# Patient Record
Sex: Female | Born: 1935 | Race: Black or African American | Hispanic: No | Smoking: Former smoker
Health system: Southern US, Community
[De-identification: ages and names within clinical notes are randomized; demographics above are authoritative.]

## PROBLEM LIST (undated history)

## (undated) DIAGNOSIS — Z8551 Personal history of malignant neoplasm of bladder: Secondary | ICD-10-CM

## (undated) DIAGNOSIS — H9190 Unspecified hearing loss, unspecified ear: Secondary | ICD-10-CM

## (undated) DIAGNOSIS — Z992 Dependence on renal dialysis: Secondary | ICD-10-CM

## (undated) DIAGNOSIS — I639 Cerebral infarction, unspecified: Secondary | ICD-10-CM

## (undated) DIAGNOSIS — I509 Heart failure, unspecified: Secondary | ICD-10-CM

## (undated) DIAGNOSIS — M069 Rheumatoid arthritis, unspecified: Secondary | ICD-10-CM

## (undated) DIAGNOSIS — N189 Chronic kidney disease, unspecified: Secondary | ICD-10-CM

## (undated) DIAGNOSIS — N186 End stage renal disease: Secondary | ICD-10-CM

## (undated) DIAGNOSIS — Z9289 Personal history of other medical treatment: Secondary | ICD-10-CM

## (undated) DIAGNOSIS — IMO0001 Reserved for inherently not codable concepts without codable children: Secondary | ICD-10-CM

## (undated) DIAGNOSIS — J449 Chronic obstructive pulmonary disease, unspecified: Secondary | ICD-10-CM

## (undated) DIAGNOSIS — N19 Unspecified kidney failure: Secondary | ICD-10-CM

## (undated) DIAGNOSIS — K449 Diaphragmatic hernia without obstruction or gangrene: Secondary | ICD-10-CM

## (undated) DIAGNOSIS — D649 Anemia, unspecified: Secondary | ICD-10-CM

## (undated) DIAGNOSIS — J189 Pneumonia, unspecified organism: Secondary | ICD-10-CM

## (undated) DIAGNOSIS — K219 Gastro-esophageal reflux disease without esophagitis: Secondary | ICD-10-CM

## (undated) DIAGNOSIS — R0602 Shortness of breath: Secondary | ICD-10-CM

## (undated) DIAGNOSIS — I4891 Unspecified atrial fibrillation: Secondary | ICD-10-CM

## (undated) DIAGNOSIS — J439 Emphysema, unspecified: Secondary | ICD-10-CM

## (undated) DIAGNOSIS — R001 Bradycardia, unspecified: Secondary | ICD-10-CM

## (undated) DIAGNOSIS — I959 Hypotension, unspecified: Secondary | ICD-10-CM

## (undated) DIAGNOSIS — E119 Type 2 diabetes mellitus without complications: Secondary | ICD-10-CM

## (undated) DIAGNOSIS — Z9181 History of falling: Secondary | ICD-10-CM

## (undated) DIAGNOSIS — G4730 Sleep apnea, unspecified: Secondary | ICD-10-CM

## (undated) DIAGNOSIS — I10 Essential (primary) hypertension: Secondary | ICD-10-CM

## (undated) DIAGNOSIS — Z636 Dependent relative needing care at home: Secondary | ICD-10-CM

## (undated) HISTORY — DX: Hypotension, unspecified: I95.9

## (undated) HISTORY — DX: Anemia, unspecified: D64.9

## (undated) HISTORY — DX: Pneumonia, unspecified organism: J18.9

## (undated) HISTORY — DX: Gastro-esophageal reflux disease without esophagitis: K21.9

## (undated) HISTORY — DX: Diaphragmatic hernia without obstruction or gangrene: K44.9

## (undated) HISTORY — PX: TRANSURETHRAL RESECTION OF BLADDER TUMOR: SHX2575

## (undated) HISTORY — DX: History of falling: Z91.81

## (undated) HISTORY — DX: Sleep apnea, unspecified: G47.30

## (undated) HISTORY — PX: CATARACT EXTRACTION: SUR2

## (undated) HISTORY — DX: End stage renal disease: N18.6

## (undated) HISTORY — DX: Personal history of other medical treatment: Z92.89

## (undated) HISTORY — DX: Cerebral infarction, unspecified: I63.9

## (undated) HISTORY — DX: Shortness of breath: R06.02

## (undated) HISTORY — DX: Chronic obstructive pulmonary disease, unspecified: J44.9

## (undated) HISTORY — PX: CYSTOSCOPY: SUR368

## (undated) HISTORY — DX: Bradycardia, unspecified: R00.1

## (undated) HISTORY — PX: JOINT REPLACEMENT: SHX530

## (undated) HISTORY — DX: Unspecified hearing loss, unspecified ear: H91.90

## (undated) HISTORY — DX: Dependent relative needing care at home: Z63.6

## (undated) HISTORY — PX: TOTAL KNEE ARTHROPLASTY: SHX125

## (undated) HISTORY — DX: End stage renal disease: Z99.2

## (undated) HISTORY — PX: LAMINECTOMY: SHX219

## (undated) HISTORY — PX: PACEMAKER INSERTION: SHX728

## (undated) HISTORY — DX: Rheumatoid arthritis, unspecified: M06.9

## (undated) HISTORY — DX: Personal history of malignant neoplasm of bladder: Z85.51

## (undated) HISTORY — PX: TONSILLECTOMY: SUR1361

## (undated) HISTORY — PX: ABDOMINAL HYSTERECTOMY: SHX81

## (undated) HISTORY — PX: CHOLECYSTECTOMY: SHX55

## (undated) HISTORY — DX: Heart failure, unspecified: I50.9

## (undated) HISTORY — DX: Reserved for inherently not codable concepts without codable children: IMO0001

## (undated) HISTORY — PX: BACK SURGERY: SHX140

## (undated) HISTORY — DX: Chronic kidney disease, unspecified: N18.9

## (undated) HISTORY — PX: SHOULDER SURGERY: SHX246

## (undated) HISTORY — PX: AV FISTULA PLACEMENT: SHX1204

---

## 2009-11-07 ENCOUNTER — Ambulatory Visit: Payer: Self-pay | Admitting: Radiology

## 2009-11-07 ENCOUNTER — Emergency Department (HOSPITAL_BASED_OUTPATIENT_CLINIC_OR_DEPARTMENT_OTHER): Admission: EM | Admit: 2009-11-07 | Discharge: 2009-11-07 | Payer: Self-pay | Admitting: Emergency Medicine

## 2010-11-09 LAB — COMPREHENSIVE METABOLIC PANEL
ALT: 20 U/L (ref 0–35)
AST: 35 U/L (ref 0–37)
Albumin: 4 g/dL (ref 3.5–5.2)
Chloride: 102 mEq/L (ref 96–112)
Potassium: 5.1 mEq/L (ref 3.5–5.1)
Total Bilirubin: 0.7 mg/dL (ref 0.3–1.2)

## 2010-11-09 LAB — POCT CARDIAC MARKERS
CKMB, poc: 1.7 ng/mL (ref 1.0–8.0)
CKMB, poc: <1 ng/mL — ABNORMAL LOW (ref 1.0–8.0)
Myoglobin, poc: 143 ng/mL (ref 12–200)
Myoglobin, poc: 161 ng/mL (ref 12–200)
Troponin i, poc: <0.05 ng/mL (ref 0.00–0.09)
Troponin i, poc: <0.05 ng/mL (ref 0.00–0.09)

## 2010-11-09 LAB — CBC
HCT: 26.9 % — ABNORMAL LOW (ref 36.0–46.0)
Hemoglobin: 8.6 g/dL — ABNORMAL LOW (ref 12.0–15.0)
MCV: 76.2 fL — ABNORMAL LOW (ref 78.0–100.0)
Platelets: 213 10*3/uL (ref 150–400)
RBC: 3.53 MIL/uL — ABNORMAL LOW (ref 3.87–5.11)
RDW: 15.7 % — ABNORMAL HIGH (ref 11.5–15.5)

## 2010-11-09 LAB — DIFFERENTIAL
Lymphocytes Relative: 15 % (ref 12–46)
Lymphs Abs: 1.3 10*3/uL (ref 0.7–4.0)
Monocytes Relative: 10 % (ref 3–12)

## 2010-11-09 LAB — PROTIME-INR: Prothrombin Time: 15.8 seconds — ABNORMAL HIGH (ref 11.6–15.2)

## 2010-11-09 LAB — POCT B-TYPE NATRIURETIC PEPTIDE (BNP): B Natriuretic Peptide, POC: 613 pg/mL — ABNORMAL HIGH (ref 0–100)

## 2013-12-01 ENCOUNTER — Emergency Department (HOSPITAL_BASED_OUTPATIENT_CLINIC_OR_DEPARTMENT_OTHER): Payer: MEDICARE

## 2013-12-01 ENCOUNTER — Inpatient Hospital Stay (HOSPITAL_BASED_OUTPATIENT_CLINIC_OR_DEPARTMENT_OTHER)
Admission: EM | Admit: 2013-12-01 | Discharge: 2013-12-04 | DRG: 064 | Disposition: A | Discharge status: Home Health | Payer: MEDICARE | Attending: Internal Medicine | Admitting: Internal Medicine

## 2013-12-01 ENCOUNTER — Encounter (HOSPITAL_BASED_OUTPATIENT_CLINIC_OR_DEPARTMENT_OTHER): Payer: Self-pay | Admitting: Emergency Medicine

## 2013-12-01 DIAGNOSIS — R4789 Other speech disturbances: Secondary | ICD-10-CM | POA: Diagnosis present

## 2013-12-01 DIAGNOSIS — E118 Type 2 diabetes mellitus with unspecified complications: Secondary | ICD-10-CM

## 2013-12-01 DIAGNOSIS — I639 Cerebral infarction, unspecified: Secondary | ICD-10-CM | POA: Diagnosis present

## 2013-12-01 DIAGNOSIS — M899 Disorder of bone, unspecified: Secondary | ICD-10-CM | POA: Diagnosis present

## 2013-12-01 DIAGNOSIS — H9190 Unspecified hearing loss, unspecified ear: Secondary | ICD-10-CM | POA: Diagnosis present

## 2013-12-01 DIAGNOSIS — I4891 Unspecified atrial fibrillation: Secondary | ICD-10-CM | POA: Diagnosis present

## 2013-12-01 DIAGNOSIS — I6350 Cerebral infarction due to unspecified occlusion or stenosis of unspecified cerebral artery: Principal | ICD-10-CM

## 2013-12-01 DIAGNOSIS — J449 Chronic obstructive pulmonary disease, unspecified: Secondary | ICD-10-CM | POA: Diagnosis present

## 2013-12-01 DIAGNOSIS — E1165 Type 2 diabetes mellitus with hyperglycemia: Secondary | ICD-10-CM

## 2013-12-01 DIAGNOSIS — Z992 Dependence on renal dialysis: Secondary | ICD-10-CM

## 2013-12-01 DIAGNOSIS — I10 Essential (primary) hypertension: Secondary | ICD-10-CM

## 2013-12-01 DIAGNOSIS — Z794 Long term (current) use of insulin: Secondary | ICD-10-CM

## 2013-12-01 DIAGNOSIS — N186 End stage renal disease: Secondary | ICD-10-CM

## 2013-12-01 DIAGNOSIS — Z8673 Personal history of transient ischemic attack (TIA), and cerebral infarction without residual deficits: Secondary | ICD-10-CM

## 2013-12-01 DIAGNOSIS — E785 Hyperlipidemia, unspecified: Secondary | ICD-10-CM | POA: Diagnosis present

## 2013-12-01 DIAGNOSIS — Z87891 Personal history of nicotine dependence: Secondary | ICD-10-CM

## 2013-12-01 DIAGNOSIS — R4701 Aphasia: Secondary | ICD-10-CM | POA: Diagnosis present

## 2013-12-01 DIAGNOSIS — M949 Disorder of cartilage, unspecified: Secondary | ICD-10-CM

## 2013-12-01 DIAGNOSIS — J438 Other emphysema: Secondary | ICD-10-CM | POA: Diagnosis present

## 2013-12-01 DIAGNOSIS — D649 Anemia, unspecified: Secondary | ICD-10-CM | POA: Diagnosis present

## 2013-12-01 DIAGNOSIS — IMO0002 Reserved for concepts with insufficient information to code with codable children: Secondary | ICD-10-CM

## 2013-12-01 DIAGNOSIS — E119 Type 2 diabetes mellitus without complications: Secondary | ICD-10-CM | POA: Diagnosis present

## 2013-12-01 DIAGNOSIS — Z66 Do not resuscitate: Secondary | ICD-10-CM | POA: Diagnosis present

## 2013-12-01 DIAGNOSIS — R471 Dysarthria and anarthria: Secondary | ICD-10-CM | POA: Diagnosis present

## 2013-12-01 DIAGNOSIS — I120 Hypertensive chronic kidney disease with stage 5 chronic kidney disease or end stage renal disease: Secondary | ICD-10-CM | POA: Diagnosis present

## 2013-12-01 DIAGNOSIS — Z79899 Other long term (current) drug therapy: Secondary | ICD-10-CM

## 2013-12-01 HISTORY — DX: Unspecified kidney failure: N19

## 2013-12-01 HISTORY — DX: Essential (primary) hypertension: I10

## 2013-12-01 HISTORY — DX: Dependence on renal dialysis: Z99.2

## 2013-12-01 HISTORY — DX: Type 2 diabetes mellitus without complications: E11.9

## 2013-12-01 HISTORY — DX: Cerebral infarction, unspecified: I63.9

## 2013-12-01 HISTORY — DX: Emphysema, unspecified: J43.9

## 2013-12-01 LAB — CBC
HCT: 35.8 % — ABNORMAL LOW (ref 36.0–46.0)
Hemoglobin: 11.9 g/dL — ABNORMAL LOW (ref 12.0–15.0)
MCH: 24.4 pg — ABNORMAL LOW (ref 26.0–34.0)
MCHC: 33.2 g/dL (ref 30.0–36.0)
MCV: 73.5 fL — ABNORMAL LOW (ref 78.0–100.0)
Platelets: 167 10*3/uL (ref 150–400)
RBC: 4.87 MIL/uL (ref 3.87–5.11)
RDW: 17.2 % — AB (ref 11.5–15.5)
WBC: 5.4 10*3/uL (ref 4.0–10.5)

## 2013-12-01 LAB — COMPREHENSIVE METABOLIC PANEL
ALBUMIN: 3.6 g/dL (ref 3.5–5.2)
ALK PHOS: 369 U/L — AB (ref 39–117)
ALT: 30 U/L (ref 0–35)
AST: 41 U/L — AB (ref 0–37)
BILIRUBIN TOTAL: 0.5 mg/dL (ref 0.3–1.2)
BUN: 27 mg/dL — ABNORMAL HIGH (ref 6–23)
CO2: 26 meq/L (ref 19–32)
Calcium: 9.9 mg/dL (ref 8.4–10.5)
Chloride: 97 mEq/L (ref 96–112)
Creatinine, Ser: 4.3 mg/dL — ABNORMAL HIGH (ref 0.50–1.10)
GFR calc Af Amer: 9 mL/min — ABNORMAL LOW (ref >90)
GFR calc non Af Amer: 8 mL/min — ABNORMAL LOW (ref >90)
Glucose, Bld: 164 mg/dL — ABNORMAL HIGH (ref 70–99)
Potassium: 4.4 mEq/L (ref 3.7–5.3)
SODIUM: 140 meq/L (ref 137–147)
Total Protein: 8.2 g/dL (ref 6.0–8.3)

## 2013-12-01 LAB — ETHANOL

## 2013-12-01 LAB — CBG MONITORING, ED: Glucose-Capillary: 147 mg/dL — ABNORMAL HIGH (ref 70–99)

## 2013-12-01 LAB — DIFFERENTIAL
BASOS ABS: 0 10*3/uL (ref 0.0–0.1)
BASOS PCT: 0 % (ref 0–1)
Eosinophils Absolute: 0.1 10*3/uL (ref 0.0–0.7)
Eosinophils Relative: 2 % (ref 0–5)
Lymphocytes Relative: 34 % (ref 12–46)
Lymphs Abs: 1.8 10*3/uL (ref 0.7–4.0)
Monocytes Absolute: 0.9 10*3/uL (ref 0.1–1.0)
Monocytes Relative: 16 % — ABNORMAL HIGH (ref 3–12)
NEUTROS ABS: 2.6 10*3/uL (ref 1.7–7.7)
NEUTROS PCT: 47 % (ref 43–77)

## 2013-12-01 LAB — PROTIME-INR
INR: 1.14 (ref 0.00–1.49)
Prothrombin Time: 14.4 seconds (ref 11.6–15.2)

## 2013-12-01 LAB — TROPONIN I: Troponin I: <0.3 ng/mL (ref <0.30)

## 2013-12-01 LAB — GLUCOSE, CAPILLARY
GLUCOSE-CAPILLARY: 90 mg/dL (ref 70–99)
Glucose-Capillary: 106 mg/dL — ABNORMAL HIGH (ref 70–99)

## 2013-12-01 LAB — APTT: aPTT: 39 seconds — ABNORMAL HIGH (ref 24–37)

## 2013-12-01 MED ORDER — ASPIRIN 325 MG PO TABS
325.0000 mg | ORAL_TABLET | Freq: Every day | ORAL | Status: DC
  Administered 2013-12-03 – 2013-12-04 (×2): 325 mg via ORAL
  Filled 2013-12-01 (×3): qty 1

## 2013-12-01 MED ORDER — PANTOPRAZOLE SODIUM 40 MG IV SOLR
40.0000 mg | Freq: Every day | INTRAVENOUS | Status: DC
  Administered 2013-12-01 – 2013-12-02 (×2): 40 mg via INTRAVENOUS
  Filled 2013-12-01 (×4): qty 40

## 2013-12-01 MED ORDER — ALPRAZOLAM 0.25 MG PO TABS: 0.2500 mg | ORAL_TABLET | Freq: Once | ORAL | Status: AC | PRN

## 2013-12-01 MED ORDER — ACETAMINOPHEN 650 MG RE SUPP: 650.0000 mg | RECTAL | Status: DC | PRN

## 2013-12-01 MED ORDER — ASPIRIN 300 MG RE SUPP
300.0000 mg | Freq: Every day | RECTAL | Status: DC
  Administered 2013-12-01 – 2013-12-02 (×2): 300 mg via RECTAL
  Filled 2013-12-01 (×4): qty 1

## 2013-12-01 MED ORDER — SODIUM CHLORIDE 0.9 % IV SOLN
INTRAVENOUS | Status: DC
  Administered 2013-12-01: 22:00:00 via INTRAVENOUS

## 2013-12-01 MED ORDER — INSULIN ASPART 100 UNIT/ML ~~LOC~~ SOLN: 0.0000 [IU] | SUBCUTANEOUS | Status: DC

## 2013-12-01 MED ORDER — ACETAMINOPHEN 325 MG PO TABS
650.0000 mg | ORAL_TABLET | ORAL | Status: DC | PRN
  Administered 2013-12-02: 650 mg via ORAL
  Filled 2013-12-01: qty 2

## 2013-12-01 MED ORDER — ALBUTEROL SULFATE (2.5 MG/3ML) 0.083% IN NEBU
2.5000 mg | INHALATION_SOLUTION | Freq: Four times a day (QID) | RESPIRATORY_TRACT | Status: DC | PRN
Start: 2013-12-01 — End: 2013-12-04

## 2013-12-01 MED ORDER — GABAPENTIN 300 MG PO CAPS
300.0000 mg | ORAL_CAPSULE | Freq: Every day | ORAL | Status: DC
  Administered 2013-12-02 – 2013-12-03 (×2): 300 mg via ORAL
  Filled 2013-12-01 (×4): qty 1

## 2013-12-01 MED ORDER — ALLOPURINOL 100 MG PO TABS
100.0000 mg | ORAL_TABLET | Freq: Every day | ORAL | Status: DC
  Administered 2013-12-03 – 2013-12-04 (×2): 100 mg via ORAL
  Filled 2013-12-01 (×4): qty 1

## 2013-12-01 MED ORDER — ALBUTEROL SULFATE (2.5 MG/3ML) 0.083% IN NEBU: 3.0000 mL | INHALATION_SOLUTION | Freq: Four times a day (QID) | RESPIRATORY_TRACT | Status: DC | PRN

## 2013-12-01 MED ORDER — IPRATROPIUM BROMIDE 0.02 % IN SOLN: 0.5000 mg | Freq: Four times a day (QID) | RESPIRATORY_TRACT | Status: DC

## 2013-12-01 NOTE — ED Notes (Signed)
Dr. Roselyn BeringJ Knapp and Asher MuirJamie, Charge RN aware of patient's presentation.  I will defer calling a Code Stroke to them and their assessment d/t difficulty establishing last time the patient was seen normal during my brief triage.

## 2013-12-01 NOTE — H&P (Signed)
PCP:  Renal ESRD on HD  [facility] Friday Cardiology Reuel Boomaniel with [facility] Chief Complaint:  Slurred speech  HPI: Stacy MaffucciMarie Odonnell is a 80 y.o. female   has a past medical history of Stroke; Diabetes mellitus without complication; Hypertension; Cancer; Dialysis patient; Emphysema lung; and Renal failure.   Presented with  Patient was noted today to be slurring her speech around 11:30. [facility] pm her speech has improved somewhat but not back to normal yet. She is on HD 3 times a week on M, W, and F. Last dialysis was on Friday 4/17 and went without trouble. Patient states they took off 6 lb. She have had some abdominal pain and constipation denies any fever chills or chest pain. She did not have any trouble walking. Denies any perceived weakness. She has hx of a.fib but not on anticoagulation.  Family states she was on some form of anticoagulation but that caused excessive bruising. She Had hx of TIA about 2 years ago.   Review of Systems:    Pertinent positives include: slurred speech  Constitutional:  No weight loss, night sweats, Fevers, chills, fatigue, weight loss  HEENT:  No headaches, Difficulty swallowing,Tooth/dental problems, Sore throat,  No sneezing, itching, ear ache, nasal congestion, post nasal drip,  Cardio-vascular:  No chest pain, Orthopnea, PND, anasarca, dizziness, palpitations.no Bilateral lower extremity swelling  GI:  No heartburn, indigestion, abdominal pain, nausea, vomiting, diarrhea, change in bowel habits, loss of appetite, melena, blood in stool, hematemesis Resp:  no shortness of breath at rest. No dyspnea on exertion, No excess mucus, no productive cough, No non-productive cough, No coughing up of blood.No change in color of mucus.No wheezing. Skin:  no rash or lesions. No jaundice GU:  no dysuria, change in color of urine, no urgency or frequency. No straining to urinate.  No flank pain.  Musculoskeletal:   No joint pain or no joint swelling. No decreased range of motion. No back pain.  Psych:  No change in mood or affect. No depression or anxiety. No memory loss.  Neuro: no localizing neurological complaints, no tingling, no weakness, no double vision, no gait abnormality, no , no confusion  Otherwise ROS are negative except for above, 10 systems were reviewed  Past Medical History: Past Medical History  Diagnosis Date  . Stroke   . Diabetes mellitus without complication   . Hypertension   . Cancer   . Dialysis patient   . Emphysema lung   . Renal failure    Past Surgical History  Procedure Laterality Date  . Abdominal hysterectomy    . Cholecystectomy    . Tonsillectomy    . Joint replacement    . Shoulder surgery       Medications: Prior to Admission medications   Medication Sig Start Date End Date Taking? Authorizing Provider  acetaminophen (TYLENOL) 650 MG CR tablet Take 650 mg by mouth every 8 (eight) hours as needed for pain.   Yes Historical Provider, MD  albuterol (PROVENTIL HFA;VENTOLIN HFA) 108 (90 BASE) MCG/ACT inhaler Inhale 1 puff into the lungs every 6 (six) hours as needed for wheezing or shortness of breath.   Yes Historical Provider, MD  albuterol (PROVENTIL) (2.5 MG/3ML) 0.083% nebulizer solution Take 2.5 mg by nebulization every 6 (six) hours as needed for wheezing or shortness of breath.   Yes Historical Provider, MD  allopurinol (ZYLOPRIM) 100 MG tablet Take 100 mg by mouth daily.   Yes Historical  Provider, MD  aspirin 81 MG tablet Take 81 mg by mouth daily.   Yes Historical Provider, MD  calcium carbonate (OS-CAL) 600 MG TABS tablet Take 600 mg by mouth 2 (two) times daily with a meal. With D   Yes Historical Provider, MD  clotrimazole (LOTRIMIN) 1 % cream Apply 1 application topically.   Yes Historical Provider, MD  cyclobenzaprine (FLEXERIL) 5 MG tablet Take 5 mg by mouth as needed for muscle spasms.   Yes Historical Provider, MD  docusate sodium  (COLACE) 100 MG capsule Take 100 mg by mouth 2 (two) times daily.   Yes Historical Provider, MD  furosemide (LASIX) 40 MG tablet Take 40 mg by mouth 2 (two) times daily.   Yes Historical Provider, MD  gabapentin (NEURONTIN) 300 MG capsule Take 300 mg by mouth at bedtime.   Yes Historical Provider, MD  insulin glargine (LANTUS) 100 UNIT/ML injection Inject into the skin at bedtime. 10-30 units   Yes Historical Provider, MD  lactobacillus acidophilus (BACID) TABS tablet Take 1 tablet by mouth 3 (three) times daily.   Yes Historical Provider, MD  omega-3 acid ethyl esters (LOVAZA) 1 G capsule Take 1,500 mg by mouth 2 (two) times daily. Omega 3,6,9   Yes Historical Provider, MD  omeprazole (PRILOSEC) 20 MG capsule Take 20 mg by mouth 2 (two) times daily before a meal.   Yes Historical Provider, MD  sevelamer carbonate (RENVELA) 800 MG tablet Take 800 mg by mouth 3 (three) times daily with meals. Once with snack   Yes Historical Provider, MD  simvastatin (ZOCOR) 40 MG tablet Take 40 mg by mouth daily.   Yes Historical Provider, MD  tiotropium (SPIRIVA) 18 MCG inhalation capsule Place 18 mcg into inhaler and inhale daily.   Yes Historical Provider, MD    Allergies:   Allergies  Allergen Reactions  . Codeine   . Levaquin [Levofloxacin In D5w]   . Vicodin [Hydrocodone-Acetaminophen]     Confused, hypoxia  . Vistaril [Hydroxyzine Hcl]     Social History:  Ambulatory  walker   Lives at   Home with family   reports that she has quit smoking. She does not have any smokeless tobacco history on file.   [facility] [phone] Cell [phone]   Family History: family history is not on file.    Physical Exam: Patient Vitals for the past 24 hrs:  BP Temp Temp src Pulse Resp SpO2 Height Weight  12/01/13 1804 150/59 mmHg 98 F (36.7 C) Oral 57 16 99 % 5\' 3"  (1.6 m) 83.961 kg (185 lb 1.6 oz)  12/01/13 1627 138/57 mmHg - - 60 16 96 % - -  12/01/13 1548 139/33 mmHg - - 54 16 97 % -  -  12/01/13 1357 151/58 mmHg - - 68 20 96 % - -    1. General:  in No Acute distress 2. Psychological: Alert and  Oriented but with slurred speech 3. Head/ENT:   Moist   Mucous Membranes                          Head Non traumatic, neck supple                          Normal   Dentition 4. SKIN: normal  Skin turgor,  Skin clean Dry and intact no rash 5. Heart: Regular rate and rhythm systolic Murmur, Rub or gallop 6. Lungs:  Clear to auscultation bilaterally, no wheezes or crackles   7. Abdomen: Soft, non-tender, Non distended 8. Lower extremities: no clubbing, cyanosis, or edema 9. Neurologically somewhat diminished grip in Left arm and Leg she is unsure if a change from prior patient has hx of right MCA CVA in the past. Slurred speech and word finding difficulty but CN 2-12 intact 10. MSK: Normal range of motion  body mass index is 32.8 kg/(m^2).   Labs on Admission:   Recent Labs  12/01/13 1505  NA 140  K 4.4  CL 97  CO2 26  GLUCOSE 164*  BUN 27*  CREATININE 4.30*  CALCIUM 9.9    Recent Labs  12/01/13 1505  AST 41*  ALT 30  ALKPHOS 369*  BILITOT 0.5  PROT 8.2  ALBUMIN 3.6   No results found for this basename: LIPASE, AMYLASE,  in the last 72 hours  Recent Labs  12/01/13 1505  WBC 5.4  NEUTROABS 2.6  HGB 11.9*  HCT 35.8*  MCV 73.5*  PLT 167    Recent Labs  12/01/13 1505  TROPONINI <0.30   No results found for this basename: TSH, T4TOTAL, FREET3, T3FREE, THYROIDAB,  in the last 72 hours No results found for this basename: VITAMINB12, FOLATE, FERRITIN, TIBC, IRON, RETICCTPCT,  in the last 72 hours No results found for this basename: HGBA1C    Estimated Creatinine Clearance: 8.6 ml/min (by C-G formula based on Cr of 4.3). ABG No results found for this basename: phart, pco2, po2, hco3, tco2, acidbasedef, o2sat     No results found for this basename: DDIMER     Other results:  I have pearsonaly reviewed this: ECG REPORT  Rate: 62  Rhythm:  a.fib with RBBB ST&T Change: no ischemic changes   Cultures: No results found for this basename: sdes, specrequest, cult, reptstatus       Radiological Exams on Admission: Ct Head Wo Contrast  12/01/2013   CLINICAL DATA:  80 year old female code stroke. Slurred speech. Initial encounter.  EXAM: CT HEAD WITHOUT CONTRAST  TECHNIQUE: Contiguous axial images were obtained from the base of the skull through the vertex without intravenous contrast.  COMPARISON:  08/22/2013 and earlier.  FINDINGS: Stable visualized osseous structures. Stable paranasal sinuses and mastoids. No acute orbit or scalp soft tissue findings.  Calcified atherosclerosis at the skull base. Cerebral volume is within normal limits for age. Chronic posterior right MCA infarct re- identified with encephalomalacia. Chronic small left PCA infarct affecting the left occipital pole re- identified and stable.  New cortically based hypodensity in the posterior left MCA territory. Series 2, image 23). Parietal lobe primarily affected. No associated mass effect or hemorrhage is evident.  No suspicious intracranial vascular hyperdensity. Stable gray-white matter differentiation elsewhere. No ventriculomegaly. No midline shift, mass effect, or evidence of intracranial mass lesion.  IMPRESSION: 1. Acute/subacute posterior left MCA infarct. No associated mass effect or hemorrhage at this time. 2. Stable chronic right MCA and left PCA infarcts. Study discussed by telephone with Dr. Linwood Dibbles on 12/01/2013 at 14:34 .   Electronically Signed   By: Augusto Gamble M.D.   On: 12/01/2013 14:34    Chart has been reviewed  Assessment/Plan 80 yo F with hx of A.fib, DM, HTN, ESRD on HD M,W,F here with Left MCA infarct  Present on Admission:  . Stroke -   will admit based on TIA/CVA protocol, await results of MRA/MRI, Carotid Doppler and Echo,    ECG, Lipid panel, TSH. Order PT/OT evaluation. Will make  sure patient is on antiplatelet agent.  Neurology consult  spoke Dr. Roseanne RenoStewart who is aware.     . Hypertension - hold lasix for first 24 hours restart as needed . Type II or unspecified type diabetes mellitus with unspecified complication, uncontrolled - hold lantus as patient have not passed swallow eval and will be NPO . ESRD on dialysis - will arrange for HD on Monday,     Prophylaxis: SCD,   Protonix  CODE STATUS: DNR/DNI as per Family and patient  Other plan as per orders.  I have spent a total of 55 min on this admission  Ladarion Munyon 12/01/2013, 7:13 PM

## 2013-12-01 NOTE — ED Provider Notes (Signed)
CSN: [id number]     Arrival date & time 12/01/13  1340 History  First MD Initiated Contact with Patient 12/01/13 1351     Chief Complaint  Patient presents with  . Code Stroke   HPI Patient presents to the emergency room with an acute change in her ability to speak. The daughter states that she woke patient up at 0930 this morning to check her blood sugar. Patient had been sleeping at the time. When the patient woke up she was speaking normally.  The daughter checked her blood sugar and then left for a period of time.  Except on her again they noted that she was having difficulty with her speech. She seemed to try to say certain words but was unable to do so. Patient has history of kidney failure. She is on dialysis Monday Wednesday and Friday. She usually can walk with a walker.  He has no issues with confusion. She does have history of prior TIA/stroke but no chronic deficits. She does not have any history of recent surgery. No head injury. Past Medical History  Diagnosis Date  . Stroke   . Diabetes mellitus without complication   . Hypertension   . Cancer   . Dialysis patient   . Emphysema lung   . Renal failure    Past Surgical History  Procedure Laterality Date  . Abdominal hysterectomy    . Cholecystectomy    . Tonsillectomy    . Joint replacement    . Shoulder surgery     No family history on file. History  Substance Use Topics  . Smoking status: Former Games developer  . Smokeless tobacco: Not on file  . Alcohol Use: Not on file   OB History   Grav Para Term Preterm Abortions TAB SAB Ect Mult Living                 Review of Systems  All other systems reviewed and are negative.     Allergies  Codeine; Levaquin; and Vistaril  Home Medications   Prior to Admission medications   Medication Sig Start Date End Date Taking? Authorizing Provider  acetaminophen (TYLENOL) 650 MG CR tablet Take 650 mg by mouth every 8 (eight) hours as needed for pain.   Yes Historical  Provider, MD  albuterol (PROVENTIL HFA;VENTOLIN HFA) 108 (90 BASE) MCG/ACT inhaler Inhale 1 puff into the lungs every 6 (six) hours as needed for wheezing or shortness of breath.   Yes Historical Provider, MD  albuterol (PROVENTIL) (2.5 MG/3ML) 0.083% nebulizer solution Take 2.5 mg by nebulization every 6 (six) hours as needed for wheezing or shortness of breath.   Yes Historical Provider, MD  allopurinol (ZYLOPRIM) 100 MG tablet Take 100 mg by mouth daily.   Yes Historical Provider, MD  aspirin 81 MG tablet Take 81 mg by mouth daily.   Yes Historical Provider, MD  calcium carbonate (OS-CAL) 600 MG TABS tablet Take 600 mg by mouth 2 (two) times daily with a meal. With D   Yes Historical Provider, MD  clotrimazole (LOTRIMIN) 1 % cream Apply 1 application topically.   Yes Historical Provider, MD  cyclobenzaprine (FLEXERIL) 5 MG tablet Take 5 mg by mouth as needed for muscle spasms.   Yes Historical Provider, MD  docusate sodium (COLACE) 100 MG capsule Take 100 mg by mouth 2 (two) times daily.   Yes Historical Provider, MD  furosemide (LASIX) 40 MG tablet Take 40 mg by mouth 2 (two) times daily.   Yes Historical Provider,  MD  gabapentin (NEURONTIN) 300 MG capsule Take 300 mg by mouth at bedtime.   Yes Historical Provider, MD  insulin glargine (LANTUS) 100 UNIT/ML injection Inject into the skin at bedtime. 10-30 units   Yes Historical Provider, MD  lactobacillus acidophilus (BACID) TABS tablet Take 1 tablet by mouth 3 (three) times daily.   Yes Historical Provider, MD  omega-3 acid ethyl esters (LOVAZA) 1 G capsule Take 1,500 mg by mouth 2 (two) times daily. Omega 3,6,9   Yes Historical Provider, MD  omeprazole (PRILOSEC) 20 MG capsule Take 20 mg by mouth 2 (two) times daily before a meal.   Yes Historical Provider, MD  sevelamer carbonate (RENVELA) 800 MG tablet Take 800 mg by mouth 3 (three) times daily with meals. Once with snack   Yes Historical Provider, MD  simvastatin (ZOCOR) 40 MG tablet Take 40  mg by mouth daily.   Yes Historical Provider, MD  tiotropium (SPIRIVA) 18 MCG inhalation capsule Place 18 mcg into inhaler and inhale daily.   Yes Historical Provider, MD   BP 139/33  Pulse 54  Resp 16  SpO2 97% Physical Exam  Nursing note and vitals reviewed. Constitutional: She is oriented to person, place, and time. She appears well-developed and well-nourished. No distress.  HENT:  Head: Normocephalic and atraumatic.  Right Ear: External ear normal.  Left Ear: External ear normal.  Mouth/Throat: Oropharynx is clear and moist.  Eyes: Conjunctivae are normal. Right eye exhibits no discharge. Left eye exhibits no discharge. No scleral icterus.  Neck: Neck supple. No tracheal deviation present.  Cardiovascular: Normal rate, regular rhythm and intact distal pulses.   Pulmonary/Chest: Effort normal and breath sounds normal. No stridor. No respiratory distress. She has no wheezes. She has no rales.  Abdominal: Soft. Bowel sounds are normal. She exhibits no distension. There is no tenderness. There is no rebound and no guarding.  Musculoskeletal: She exhibits no edema and no tenderness.  Neurological: She is alert and oriented to person, place, and time. She has normal strength. No cranial nerve deficit (no facial droop, extraocular movements intact, no slurred speech) or sensory deficit. She exhibits normal muscle tone. She displays no seizure activity. Coordination normal.  No pronator drift bilateral upper extrem, able to hold both legs off bed for 5 seconds, sensation intact in all extremities, no visual field cuts, no left or right sided neglect, , no nystagmus noted, patient does have an aphasia. She is unable to speak certain words with fragmented speech   Skin: Skin is warm and dry. No rash noted.  Psychiatric: She has a normal mood and affect.    ED Course  Procedures (including critical care time) 1410  family initially told us that her last seen normal at 10:30. They have now  amended this to 9:30 in the morning  CRITICAL CARE Performed by: Celene Kras Total critical care time: 35 Critical care time was exclusive of separately billable procedures and treating other patients. Critical care was necessary to treat or prevent imminent or life-threatening deterioration. Critical care was time spent personally by me on the following activities: development of treatment plan with patient and/or surrogate as well as nursing, discussions with consultants, evaluation of patient's response to treatment, examination of patient, obtaining history from patient or surrogate, ordering and performing treatments and interventions, ordering and review of laboratory studies, ordering and review of radiographic studies, pulse oximetry and re-evaluation of patient's condition.  Labs Review Labs Reviewed  APTT - Abnormal; Notable for the following:  aPTT 39 (*)    All other components within normal limits  CBC - Abnormal; Notable for the following:    Hemoglobin 11.9 (*)    HCT 35.8 (*)    MCV 73.5 (*)    MCH 24.4 (*)    RDW 17.2 (*)    All other components within normal limits  DIFFERENTIAL - Abnormal; Notable for the following:    Monocytes Relative 16 (*)    All other components within normal limits  COMPREHENSIVE METABOLIC PANEL - Abnormal; Notable for the following:    Glucose, Bld 164 (*)    BUN 27 (*)    Creatinine, Ser 4.30 (*)    AST 41 (*)    Alkaline Phosphatase 369 (*)    GFR calc non Af Amer 8 (*)    GFR calc Af Amer 9 (*)    All other components within normal limits  CBG MONITORING, ED - Abnormal; Notable for the following:    Glucose-Capillary 147 (*)    All other components within normal limits  ETHANOL  PROTIME-INR  TROPONIN I    Imaging Review Ct Head Wo Contrast  12/01/2013   CLINICAL DATA:  80 year old female code stroke. Slurred speech. Initial encounter.  EXAM: CT HEAD WITHOUT CONTRAST  TECHNIQUE: Contiguous axial images were obtained from the  base of the skull through the vertex without intravenous contrast.  COMPARISON:  08/22/2013 and earlier.  FINDINGS: Stable visualized osseous structures. Stable paranasal sinuses and mastoids. No acute orbit or scalp soft tissue findings.  Calcified atherosclerosis at the skull base. Cerebral volume is within normal limits for age. Chronic posterior right MCA infarct re- identified with encephalomalacia. Chronic small left PCA infarct affecting the left occipital pole re- identified and stable.  New cortically based hypodensity in the posterior left MCA territory. Series 2, image 23). Parietal lobe primarily affected. No associated mass effect or hemorrhage is evident.  No suspicious intracranial vascular hyperdensity. Stable gray-white matter differentiation elsewhere. No ventriculomegaly. No midline shift, mass effect, or evidence of intracranial mass lesion.  IMPRESSION: 1. Acute/subacute posterior left MCA infarct. No associated mass effect or hemorrhage at this time. 2. Stable chronic right MCA and left PCA infarcts. Study discussed by telephone with Dr. Linwood DibblesJON Timothee Gali on 12/01/2013 at 14:34 .   Electronically Signed   By: Augusto GambleLee  Hall M.D.   On: 12/01/2013 14:34     EKG Interpretation   Date/Time:  Saturday December 01 2013 14:38:25 EDT Ventricular Rate:  62 PR Interval:    QRS Duration: 120 QT Interval:  496 QTC Calculation: 503 R Axis:   -87 Text Interpretation:  Atrial fibrillation with premature ventricular or  aberrantly conducted complexes Pulmonary disease pattern Right bundle  branch block Left anterior fascicular block ** Bifascicular block **  Septal infarct , age undetermined Abnormal ECG No significant change since  last tracing Confirmed by Kamayah Pillay  MD-J, Shyheim Tanney ([id number]) on 12/01/2013 2:44:52 PM        MDM   Final diagnoses:  Stroke    Patient appears to have an acute stroke. She has an NIH stroke scale of 4 on my initial exam.  Initially the [facility]. He  communicated with Dr. Migdalia DkMirin, Neurology , [facility].  They do not have a narrow radiologist [place]. Considering that the patient is within an 8 hour timeframe she may be a candidate for particular interventions. He does [facility].  I Discussed this with the family and they  agree  I discussed the case with Dr Amada JupiterKirkpatrick at [facility].  Pt is outside of any TPA window.  At 5492, she would not be a candidate for acute vascular intervention.  Pt does appear to have a stroke on CT scan.  Recommend hospitalist admission , neurology consultation.   Discussed case with Dr Benjamine MolaVann.  [facility].     Celene KrasJon R Corisa Montini, MD 12/01/13 [phone]

## 2013-12-01 NOTE — ED Notes (Signed)
Code Stroke activated.

## 2013-12-01 NOTE — Progress Notes (Signed)
[facility]- aphasia after nap CT scan + Not code stroke or TPA candidate Tele admit HD- M/W/F Call neuro when arrives  Marlin CanaryJessica Zan Triska DO

## 2013-12-01 NOTE — ED Notes (Signed)
Patient stuck multiple times x 3 RN's for a PIV and labs. Dr. Bevely PalmerMade aware.

## 2013-12-01 NOTE — ED Notes (Signed)
After further discussion family member that saw patient this am reports that LSW was actually 0930, not 1030.

## 2013-12-01 NOTE — ED Notes (Signed)
Per MD , patient no longer Code Stroke and will be admitted by hospitalist.

## 2013-12-01 NOTE — ED Notes (Signed)
Daughter reports that mother has had disorientation, altered speech pattern since 11:00 this morning after waking up from a nap.  Reports she checked on her mother at 10:30, took her blood sugar, and everything seemed normal at that time.  Patient is noted to have an expressive dysphasia on triage.  Patient normally walks around with a walker, talks without difficulty, completely alert and oriented.  This is a drastic change according to the family.

## 2013-12-01 NOTE — Consult Note (Signed)
Referring Physician: Dr. Adela Glimpseoutova    Chief Complaint: Acute onset of speech difficulty.  HPI: Stacy Odonnell is an 80 y.o. female with a history of. diabetes mellitus, hypertension and lipidemia who developed acute onset of slurred speech at 11:30 AM today. Patient also has a history of 2 previous strokes. She arrived in the emergency room for evaluation at 3 PM. She was beyond time window for consideration for treatment with TPA. CT scan of her head showed acute/subacute area of reduced attenuation involving the left posterior parietal region, in addition to old strokes involving right parietal and left occipital areas. Patient's speech has improved but is not back to normal per family. She's been taking aspirin daily. NIH stroke score was 3.  LSN: 11:30 AM on 12/01/2013 tPA Given: No: Beyond time window for treatment consideration MRankin: 1  Past Medical History  Diagnosis Date  . Stroke   . Diabetes mellitus without complication   . Hypertension   . Cancer   . Dialysis patient   . Emphysema lung   . Renal failure     No family history on file.   Medications: I have reviewed the patient's current medications.  ROS: History obtained from child and the patient  General ROS: negative for - chills, fatigue, fever, night sweats, weight gain or weight loss Psychological ROS: negative for - behavioral disorder, hallucinations, memory difficulties, mood swings or suicidal ideation Ophthalmic ROS: negative for - blurry vision, double vision, eye pain or loss of vision ENT ROS: negative for - epistaxis, nasal discharge, oral lesions, sore throat, tinnitus or vertigo Allergy and Immunology ROS: negative for - hives or itchy/watery eyes Hematological and Lymphatic ROS: negative for - bleeding problems, bruising or swollen lymph nodes Endocrine ROS: negative for - galactorrhea, hair pattern changes, polydipsia/polyuria or temperature intolerance Respiratory ROS: negative for - cough,  hemoptysis, shortness of breath or wheezing Cardiovascular ROS: negative for - chest pain, dyspnea on exertion, edema or irregular heartbeat Gastrointestinal ROS: negative for - abdominal pain, diarrhea, hematemesis, nausea/vomiting or stool incontinence Genito-Urinary ROS: negative for - dysuria, hematuria, incontinence or urinary frequency/urgency Musculoskeletal ROS: negative for - joint swelling or muscular weakness Neurological ROS: as noted in HPI Dermatological ROS: negative for rash and skin lesion changes  Physical Examination: Blood pressure 125/58, pulse 57, temperature 98.1 F (36.7 C), temperature source Oral, resp. rate 16, height 5\' 3"  (1.6 m), weight 83.961 kg (185 lb 1.6 oz), SpO2 96.00%.  Neurologic Examination: Mental Status: Alert, oriented, thought content appropriate.  Speech slightly slurred with some mild word finding difficulty. Able to follow commands without difficulty. Cranial Nerves: II-slight difficulty with finger counting involving right visual field. III/IV/VI-Pupils were equal and reacted. Extraocular movements were full and conjugate.    V/VII-no facial numbness and no facial weakness. VIII-normal. X-slight dysarthria; symmetrical palatal movement. Motor: 5/5 bilaterally with normal tone and bulk Sensory: Normal throughout. Deep Tendon Reflexes: Trace to 1+ and symmetric. Plantars: Flexor bilaterally Cerebellar: Normal finger-to-nose testing. Carotid auscultation: Normal  Ct Head Wo Contrast  12/01/2013   CLINICAL DATA:  80 year old female code stroke. Slurred speech. Initial encounter.  EXAM: CT HEAD WITHOUT CONTRAST  TECHNIQUE: Contiguous axial images were obtained from the base of the skull through the vertex without intravenous contrast.  COMPARISON:  08/22/2013 and earlier.  FINDINGS: Stable visualized osseous structures. Stable paranasal sinuses and mastoids. No acute orbit or scalp soft tissue findings.  Calcified atherosclerosis at the skull  base. Cerebral volume is within normal limits for age. Chronic posterior right  MCA infarct re- identified with encephalomalacia. Chronic small left PCA infarct affecting the left occipital pole re- identified and stable.  New cortically based hypodensity in the posterior left MCA territory. Series 2, image 23). Parietal lobe primarily affected. No associated mass effect or hemorrhage is evident.  No suspicious intracranial vascular hyperdensity. Stable gray-white matter differentiation elsewhere. No ventriculomegaly. No midline shift, mass effect, or evidence of intracranial mass lesion.  IMPRESSION: 1. Acute/subacute posterior left MCA infarct. No associated mass effect or hemorrhage at this time. 2. Stable chronic right MCA and left PCA infarcts. Study discussed by telephone with Dr. Linwood DibblesJON KNAPP on 12/01/2013 at 14:34 .   Electronically Signed   By: Augusto GambleLee  Hall M.D.   On: 12/01/2013 14:34    Assessment: 80 y.o. female with multiple risk factors for stroke and 2 previous strokes, presenting with acute left posterior parietal ischemic infarction.  Stroke Risk Factors - diabetes mellitus, hyperlipidemia and hypertension  Plan: 1. HgbA1c, fasting lipid panel 2. MRI, MRA  of the brain without contrast 3. PT consult, OT consult, Speech consult 4. Echocardiogram 5. Carotid dopplers 6. Prophylactic therapy-Antiplatelet med: Aspirin 7. Risk factor modification 8. Telemetry monitoring   C.R. Roseanne RenoStewart, MD [facility]  [phone]  12/01/2013, 9:24 PM

## 2013-12-01 NOTE — ED Notes (Signed)
PIV attempted by this RN right wrist unsuccessful

## 2013-12-02 ENCOUNTER — Inpatient Hospital Stay (HOSPITAL_COMMUNITY): Payer: MEDICARE

## 2013-12-02 DIAGNOSIS — J4489 Other specified chronic obstructive pulmonary disease: Secondary | ICD-10-CM

## 2013-12-02 DIAGNOSIS — J449 Chronic obstructive pulmonary disease, unspecified: Secondary | ICD-10-CM

## 2013-12-02 LAB — LIPID PANEL
CHOLESTEROL: 161 mg/dL (ref 0–200)
HDL: 108 mg/dL (ref >39)
LDL Cholesterol: 38 mg/dL (ref 0–99)
Total CHOL/HDL Ratio: 1.5 RATIO
Triglycerides: 77 mg/dL (ref <150)
VLDL: 15 mg/dL (ref 0–40)

## 2013-12-02 LAB — GLUCOSE, CAPILLARY
GLUCOSE-CAPILLARY: 91 mg/dL (ref 70–99)
Glucose-Capillary: 77 mg/dL (ref 70–99)
Glucose-Capillary: 87 mg/dL (ref 70–99)
Glucose-Capillary: 93 mg/dL (ref 70–99)

## 2013-12-02 LAB — HEMOGLOBIN A1C
HEMOGLOBIN A1C: 6.6 % — AB (ref <5.7)
Mean Plasma Glucose: 143 mg/dL — ABNORMAL HIGH (ref <117)

## 2013-12-02 MED ORDER — INSULIN ASPART 100 UNIT/ML ~~LOC~~ SOLN
0.0000 [IU] | Freq: Three times a day (TID) | SUBCUTANEOUS | Status: DC
  Administered 2013-12-03 – 2013-12-04 (×2): 1 [IU] via SUBCUTANEOUS

## 2013-12-02 MED ORDER — DEXTROSE-NACL 5-0.9 % IV SOLN
INTRAVENOUS | Status: DC
  Administered 2013-12-02: 05:00:00 via INTRAVENOUS

## 2013-12-02 NOTE — Progress Notes (Signed)
Stroke Team Progress Note  HISTORY Stacy MaffucciMarie Odonnell is a 80 y.o. female with a history of. diabetes mellitus, hypertension and lipidemia who developed acute onset of slurred speech at 11:30 AM on 12/01/2013. Patient also has a history of 2 previous strokes. She arrived in the emergency room for evaluation at 3 PM. She was beyond time window for consideration for treatment with TPA. CT scan of her head showed acute/subacute area of reduced attenuation involving the left posterior parietal region, in addition to old strokes involving right parietal and left occipital areas. Patient's speech has improved but is not back to normal per family. She's been taking aspirin daily. NIH stroke score was 3.   LSN: 11:30 AM on 12/01/2013  tPA Given: No: Beyond time window for treatment consideration  MRankin: 1   SUBJECTIVE Multiple family members at bedside. The patient is still aphasic however she is improving. Dr. Pearlean BrownieSethi discussed concerns regarding atrial fibrillation and possible anticoagulation. He recommended that they discuss this issue with the patient's nephrologist. The family reports that the patient ambulates with a walker at home but is very active and is usually mentally sharp.  OBJECTIVE Most recent Vital Signs: Filed Vitals:   12/02/13 0000 12/02/13 0207 12/02/13 0424 12/02/13 0601  BP: 119/42 115/46 123/43 144/62  Pulse: 54 57 55 56  Temp: 97.8 F (36.6 C) 97.8 F (36.6 C) 97.9 F (36.6 C) 97.7 F (36.5 C)  TempSrc: Oral Oral Oral Oral  Resp: 16 16 16 16   Height:      Weight:      SpO2: 100% 98% 96% 96%   CBG (last 3)   Recent Labs  12/01/13 2033 12/01/13 2358 12/02/13 0425  GLUCAP 106* 90 77    IV Fluid Intake:   . dextrose 5 % and 0.9% NaCl 20 mL/hr at 12/02/13 0451    MEDICATIONS  . allopurinol  100 mg Oral Daily  . aspirin  300 mg Rectal Daily   Or  . aspirin  325 mg Oral Daily  . gabapentin  300 mg Oral QHS  . insulin aspart  0-9 Units Subcutaneous 6 times per day   . pantoprazole (PROTONIX) IV  40 mg Intravenous QHS   PRN:  acetaminophen, acetaminophen, albuterol  Diet:  NPO no liquids Activity:  OOB with assistance DVT Prophylaxis:  SCDs  CLINICALLY SIGNIFICANT STUDIES Basic Metabolic Panel:  Recent Labs Lab 12/01/13 1505  NA 140  K 4.4  CL 97  CO2 26  GLUCOSE 164*  BUN 27*  CREATININE 4.30*  CALCIUM 9.9   Liver Function Tests:  Recent Labs Lab 12/01/13 1505  AST 41*  ALT 30  ALKPHOS 369*  BILITOT 0.5  PROT 8.2  ALBUMIN 3.6   CBC:  Recent Labs Lab 12/01/13 1505  WBC 5.4  NEUTROABS 2.6  HGB 11.9*  HCT 35.8*  MCV 73.5*  PLT 167   Coagulation:  Recent Labs Lab 12/01/13 1505  LABPROT 14.4  INR 1.14   Cardiac Enzymes:  Recent Labs Lab 12/01/13 1505  TROPONINI <0.30   Urinalysis: No results found for this basename: COLORURINE, APPERANCEUR, LABSPEC, PHURINE, GLUCOSEU, HGBUR, BILIRUBINUR, KETONESUR, PROTEINUR, UROBILINOGEN, NITRITE, LEUKOCYTESUR,  in the last 168 hours Lipid Panel No results found for this basename: chol, trig, hdl, cholhdl, vldl, ldlcalc   HgbA1C  No results found for this basename: HGBA1C    Urine Drug Screen:   No results found for this basename: labopia, cocainscrnur, labbenz, amphetmu, thcu, labbarb    Alcohol Level:  Recent Labs Lab  12/01/13 1505  ETH <11    Ct Head Wo Contrast 12/01/2013    1. Acute/subacute posterior left MCA infarct. No associated mass effect or hemorrhage at this time. 2. Stable chronic right MCA and left PCA infarcts.     MRI of the brain  pending  MRA of the brain  pending  2D Echocardiogram  pending  Carotid Doppler  pending  CXR     EKG atrial fibrillation rate 62 beats per minute.  For complete results please see formal report.   Therapy Recommendations pending  Physical Exam   Neurologic Examination:  Mental Status:  Alert, oriented, thought content appropriate. Speech slightly slurred with some mild word finding difficulty and  nonfluency. Able to follow commands without difficulty.  Cranial Nerves:  II-slight difficulty with finger counting involving right visual field.  III/IV/VI-Pupils were equal and reacted. Extraocular movements were full and conjugate.  V/VII-no facial numbness and no facial weakness.  VIII-normal.  X-slight dysarthria; symmetrical palatal movement.  Motor: 5/5 bilaterally with normal tone and bulk  Sensory: Normal throughout.  Deep Tendon Reflexes: Trace to 1+ and symmetric.  Plantars: Flexor bilaterally  Cerebellar: Normal finger-to-nose testing.   ASSESSMENT [name] is a 80 y.o. female presenting with acute onset of speech difficulties. TPA was not administered as the patient was beyond the window for treatment. CT - acute/subacute posterior left MCA infarct. Infarct felt to be embolic secondary to atrial fibrillation. On aspirin 81 mg orally every day prior to admission. Now on Aspirin suppository 300 mg daily for secondary stroke prevention. Patient with resultant dysarthria with mild aphasia. Stroke work up underway.   Atrial fibrillation  Hypertension  Previous strokes  COP   Endstage renal disease - on dialysis [facility]   Mildly elevated liver function tests    Hospital day # 1  TREATMENT/PLAN  Continue Aspirin suppository while patient is n.p.o. for secondary stroke prevention.  Consider anticoagulation unless contraindications - family to discuss with patient's nephrologist and [place].  Await 2-D echo, carotid Dopplers, MRI, MRA, hemoglobin A1c, and lipid profile.  Await therapy evaluations   Delton SeeDavid Rinehuls PA-C [facility] Pager [phone] 12/02/2013, 8:07 AM  I have personally obtained a history, examined the patient, evaluated imaging results, and formulated the assessment and plan of care. I agree with the above.  Delia HeadyPramod Sheniece Ruggles, MD  To contact Stroke Continuity provider, please refer to [email or web address]. After hours, contact  General Neurology

## 2013-12-02 NOTE — Evaluation (Signed)
Clinical/Bedside Swallow Evaluation Patient Details  Name: Stacy Odonnell MRN: [id number] Date of Birth: April 14, 1919  Today's Date: 12/02/2013 Time: 1545-1600 SLP Time Calculation (min): 15 min  Past Medical History:  Past Medical History  Diagnosis Date  . Stroke   . Diabetes mellitus without complication   . Hypertension   . Cancer   . Dialysis patient   . Emphysema lung   . Renal failure    Past Surgical History:  Past Surgical History  Procedure Laterality Date  . Abdominal hysterectomy    . Cholecystectomy    . Tonsillectomy    . Joint replacement    . Shoulder surgery     HPI:  Stacy Odonnell is an 80 y.o. female with a history of. diabetes mellitus, stroke,  hypertension and lipidemia who developed acute onset of slurred speech.. CT scan of her head showed acute/subacute area of reduced attenuation involving the left posterior parietal region, in addition to old strokes involving right parietal and left occipital areas. Patient's speech has improved but is not back to normal per family. She's been taking aspirin daily. NIH stroke score was 3.    Assessment / Plan / Recommendation Clinical Impression  Cognitive Linguistic Evaluation completed per Stroke Protocol.  MInimal deficits in areas of language processing.  Moderate cognitive deficits in areas of problem solving, emergent awareness, attention,  delayed recall of new information, organization and sequencing.  Minimal dysarthria present but intellgible at conversation level.   Recommend continued Skilled ST in Acute Care Setting to address deficits to improve participation with all basic ADL's and increase safety in current environment.       Aspiration Risk  Mild    Diet Recommendation Dysphagia 2 (Fine chop);Thin liquid   Liquid Administration via: Cup;No straw Medication Administration: Crushed with puree Supervision: Patient able to self feed;Staff to assist with self feeding;Full supervision/cueing for  compensatory strategies Compensations: Slow rate;Small sips/bites;Check for pocketing Postural Changes and/or Swallow Maneuvers: Seated upright 90 degrees;Upright 30-60 min after meal    Other  Recommendations Oral Care Recommendations: Oral care Q4 per protocol Other Recommendations: Clarify dietary restrictions   Follow Up Recommendations  Home health SLP    Frequency and Duration min 2x/week  2 weeks       SLP Swallow Goals  Please refer to Care Plans for Listed Goals  Swallow Study Prior Functional Status  Cognitive/Linguistic Baseline: Within functional limits Type of Home: House  Lives With: Daughter Available Help at Discharge: Family;Available 24 hours/day    General Date of Onset: 12/01/13 HPI: Stacy Odonnell is an 80 y.o. female with a history of. diabetes mellitus, stroke,  hypertension and lipidemia who developed acute onset of slurred speech.  She arrived in the emergency room for evaluation at 3 PM. She was beyond time window for consideration for treatment with TPA. CT scan of her head showed acute/subacute area of reduced attenuation involving the left posterior parietal region, in addition to old strokes involving right parietal and left occipital areas. Patient's speech has improved but is not back to normal per family. She's been taking aspirin daily. NIH stroke score was 3.  Type of Study: Bedside swallow evaluation Diet Prior to this Study: NPO Respiratory Status: Room air History of Recent Intubation: No Behavior/Cognition: Alert;Cooperative;Pleasant mood Oral Cavity - Dentition: Dentures, top Self-Feeding Abilities: Needs assist Patient Positioning: Upright in chair Baseline Vocal Quality: Clear Volitional Cough: Strong Volitional Swallow: Able to elicit    Oral/Motor/Sensory Function Overall Oral Motor/Sensory Function: Impaired Labial ROM: Reduced right  Labial Symmetry: Abnormal symmetry right Labial Strength: Reduced Labial Sensation: Reduced Lingual  ROM: Reduced right Lingual Symmetry: Abnormal symmetry right Lingual Strength: Reduced Lingual Sensation: Reduced Facial ROM: Reduced right Facial Symmetry: Right droop Facial Strength: Reduced Facial Sensation: Reduced Velum: Within Functional Limits Mandible: Within Functional Limits   Ice Chips Ice chips: Impaired Oral Phase Impairments: Impaired mastication Pharyngeal Phase Impairments: Suspected delayed Swallow;Decreased hyoid-laryngeal movement   Thin Liquid Thin Liquid: Impaired Presentation: Cup;Spoon Pharyngeal  Phase Impairments: Suspected delayed Swallow;Decreased hyoid-laryngeal movement;Multiple swallows    Nectar Thick Nectar Thick Liquid: Not tested   Honey Thick Honey Thick Liquid: Not tested   Puree Puree: Impaired Presentation: Self Fed Oral Phase Functional Implications: Prolonged oral transit;Oral residue Pharyngeal Phase Impairments: Suspected delayed Swallow;Decreased hyoid-laryngeal movement   Solid   GO    Solid: Impaired Oral Phase Impairments: Impaired mastication;Reduced lingual movement/coordination Pharyngeal Phase Impairments: Suspected delayed Swallow;Decreased hyoid-laryngeal movement      Moreen FowlerKaren Reshonda Koerber MS, CCC-SLP [phone] Lacinda AxonKaren H Ronnesha Mester 12/02/2013,4:45 PM

## 2013-12-02 NOTE — Progress Notes (Signed)
Bilateral carotid artery duplex:  1-39% ICA stenosis.  Vertebral artery flow is antegrade.     

## 2013-12-02 NOTE — Evaluation (Signed)
Physical Therapy Evaluation Patient Details Name: Stacy Odonnell MRN: [id number] DOB: 03-06-1919 Today's Date: 12/02/2013   History of Present Illness  Stacy MaffucciMarie Henle is a 80 y.o. AA female with a history of diabetes, hypertension, and two CVAs who had sudden onset yesterday of slurred speech and was taken to the ED for evaluation.  CT of the head showed an acute posterior left MCA infarct, as well as stable right MCA and left PCA infarcts.  Clinical Impression  Pt admitted with CVA. Pt currently with functional limitations due to the deficits listed below (see PT Problem List).  Pt will benefit from skilled PT to increase their independence and safety with mobility to allow discharge to the venue listed below.       Follow Up Recommendations SNF;Supervision/Assistance - 24 hour    Equipment Recommendations  None recommended by PT    Recommendations for Other Services       Precautions / Restrictions Precautions Precautions: Fall      Mobility  Bed Mobility Overal bed mobility: Needs Assistance Bed Mobility: Supine to Sit     Supine to sit: Max assist     General bed mobility comments: verbal cues for sequencing, sup to sit performed toward left side. Decreased assist may be required if transferring toward right.  Transfers Overall transfer level: Needs assistance Equipment used: Rolling walker (2 wheeled) Transfers: Sit to/from UGI CorporationStand;Stand Pivot Transfers Sit to Stand: Mod assist Stand pivot transfers: Mod assist       General transfer comment: verbal cues for hand placement, sequencing  Ambulation/Gait Ambulation/Gait assistance: Min assist Ambulation Distance (Feet): 15 Feet Assistive device: Rolling walker (2 wheeled) Gait Pattern/deviations: Decreased stride length;Steppage Gait velocity: decreased   General Gait Details: buckling/trembling of LLE with initiation of swing phase  Stairs            Wheelchair Mobility    Modified Rankin (Stroke  Patients Only) Modified Rankin (Stroke Patients Only) Pre-Morbid Rankin Score: No symptoms Modified Rankin: Moderately severe disability     Balance                                             Pertinent Vitals/Pain 0/10    Home Living Family/patient expects to be discharged to:: Private residence Living Arrangements: Children Available Help at Discharge: Family;Available 24 hours/day Type of Home: House Home Access: Stairs to enter   Entergy CorporationEntrance Stairs-Number of Steps: 1 Home Layout: One level Home Equipment: Environmental consultantWalker - 2 wheels      Prior Function Level of Independence: Independent with assistive device(s)               Hand Dominance        Extremity/Trunk Assessment   Upper Extremity Assessment: Defer to OT evaluation           Lower Extremity Assessment: Generalized weakness   LLE Deficits / Details: unable to fully assess due to aphasia     Communication   Communication: Expressive difficulties  Cognition Arousal/Alertness: Awake/alert Behavior During Therapy: WFL for tasks assessed/performed Overall Cognitive Status: Impaired/Different from baseline Area of Impairment: Problem solving             Problem Solving: Slow processing;Decreased initiation;Difficulty sequencing;Requires verbal cues      General Comments      Exercises        Assessment/Plan    PT Assessment Patient needs continued  PT services  PT Diagnosis Difficulty walking;Generalized weakness   PT Problem List Decreased activity tolerance;Decreased balance;Decreased mobility;Decreased cognition  PT Treatment Interventions DME instruction;Gait training;Functional mobility training;Stair training;Therapeutic activities;Therapeutic exercise;Patient/family education;Neuromuscular re-education;Balance training;Cognitive remediation   PT Goals (Current goals can be found in the Care Plan section) Acute Rehab PT Goals Patient Stated Goal: unable to state PT  Goal Formulation: With patient Time For Goal Achievement: 12/09/13 Potential to Achieve Goals: Good    Frequency Min 4X/week   Barriers to discharge        Co-evaluation               End of Session Equipment Utilized During Treatment: Gait belt Activity Tolerance: Patient tolerated treatment well Patient left: in chair;with call bell/phone within reach;with chair alarm set;with family/visitor present Nurse Communication: Mobility status         Time: [id number] PT Time Calculation (min): 23 min   Charges:   PT Evaluation $Initial PT Evaluation Tier I: 1 Procedure PT Treatments $Gait Training: 8-22 mins   PT G Codes:          Danelle BerryWendy R Cherisa Brucker 12/02/2013, 3:17 PM  Aida RaiderWendy Meade [place], PT  Office # [phone] Pager [phone]

## 2013-12-02 NOTE — Consult Note (Signed)
I saw the patient and agree with the above assessment and plan.  Discussed secondary CVA prevention briefly with patient and family.  They wish to defer to their outpt nephrologist and state she was on a medication taht caused excessive skin rashes, ? Bruising.  Stable.  For HD tomorrow after obtaining outpt records.

## 2013-12-02 NOTE — Progress Notes (Signed)
[facility] PROGRESS NOTE  Stacy MaffucciMarie Odonnell GNF:[id number] DOB: 06/13/1919 DOA: 12/01/2013 PCP: No PCP Per Patient  Assessment/Plan: 1. Acute/subacute L MCA Infarct 1. Neurology following 2. Await MRI brain 3. 2D echo pending 4. Carotid dopplers unremarkable 5. PT/OT/SLP consulted 6. LDL 38 2. HTN 1. BP stable and controlled 2. Cont current meds 3. DM 1. FSBS stable and controlled 2. Cont SSI coverage for now 3. Diabetic diet when passes swallow 4. COPD 1. Stable 2. On min O2 support 5. ESRD 1. If pt planned to stay, will consult Nephrology for scheduled MWF HD 6. DVT prophylaxis 1. SCD's  Code Status: DNR Family Communication: Pt in room (indicate person spoken with, relationship, and if by phone, the number) Disposition Plan: Pending   Consultants:  Neurology  Procedures:    Antibiotics:  none  HPI/Subjective: No complaints. No acute events overnight  Objective: Filed Vitals:   12/02/13 0207 12/02/13 0424 12/02/13 0601 12/02/13 0942  BP: 115/46 123/43 144/62 137/67  Pulse: 57 55 56 58  Temp: 97.8 F (36.6 C) 97.9 F (36.6 C) 97.7 F (36.5 C) 98 F (36.7 C)  TempSrc: Oral Oral Oral Oral  Resp: 16 16 16 16   Height:      Weight:      SpO2: 98% 96% 96% 97%   No intake or output data in the 24 hours ending 12/02/13 1157 Filed Weights   12/01/13 1804  Weight: 83.961 kg (185 lb 1.6 oz)    Exam:   General:  Awake, in nad  Cardiovascular: regular, s1,s 2  Respiratory: normal resp effort, no wheezing  Abdomen: soft, nondistended  Musculoskeletal: perfused, no clubbing   Data Reviewed: Basic Metabolic Panel:  Recent Labs Lab 12/01/13 1505  NA 140  K 4.4  CL 97  CO2 26  GLUCOSE 164*  BUN 27*  CREATININE 4.30*  CALCIUM 9.9   Liver Function Tests:  Recent Labs Lab 12/01/13 1505  AST 41*  ALT 30  ALKPHOS 369*  BILITOT 0.5  PROT 8.2  ALBUMIN 3.6   No results found for this basename: LIPASE, AMYLASE,  in the last 168  hours No results found for this basename: AMMONIA,  in the last 168 hours CBC:  Recent Labs Lab 12/01/13 1505  WBC 5.4  NEUTROABS 2.6  HGB 11.9*  HCT 35.8*  MCV 73.5*  PLT 167   Cardiac Enzymes:  Recent Labs Lab 12/01/13 1505  TROPONINI <0.30   BNP (last 3 results) No results found for this basename: PROBNP,  in the last 8760 hours CBG:  Recent Labs Lab 12/01/13 1355 12/01/13 2033 12/01/13 2358 12/02/13 0425 12/02/13 0811  GLUCAP 147* 106* 90 77 87    No results found for this or any previous visit (from the past 240 hour(s)).   Studies: Ct Head Wo Contrast  12/01/2013   CLINICAL DATA:  80 year old female code stroke. Slurred speech. Initial encounter.  EXAM: CT HEAD WITHOUT CONTRAST  TECHNIQUE: Contiguous axial images were obtained from the base of the skull through the vertex without intravenous contrast.  COMPARISON:  08/22/2013 and earlier.  FINDINGS: Stable visualized osseous structures. Stable paranasal sinuses and mastoids. No acute orbit or scalp soft tissue findings.  Calcified atherosclerosis at the skull base. Cerebral volume is within normal limits for age. Chronic posterior right MCA infarct re- identified with encephalomalacia. Chronic small left PCA infarct affecting the left occipital pole re- identified and stable.  New cortically based hypodensity in the posterior left MCA territory. Series 2, image 23). Parietal  lobe primarily affected. No associated mass effect or hemorrhage is evident.  No suspicious intracranial vascular hyperdensity. Stable gray-white matter differentiation elsewhere. No ventriculomegaly. No midline shift, mass effect, or evidence of intracranial mass lesion.  IMPRESSION: 1. Acute/subacute posterior left MCA infarct. No associated mass effect or hemorrhage at this time. 2. Stable chronic right MCA and left PCA infarcts. Study discussed by telephone with Dr. Linwood DibblesJON KNAPP on 12/01/2013 at 14:34 .   Electronically Signed   By: Augusto GambleLee  Hall M.D.    On: 12/01/2013 14:34    Scheduled Meds: . allopurinol  100 mg Oral Daily  . aspirin  300 mg Rectal Daily   Or  . aspirin  325 mg Oral Daily  . gabapentin  300 mg Oral QHS  . insulin aspart  0-9 Units Subcutaneous 6 times per day  . pantoprazole (PROTONIX) IV  40 mg Intravenous QHS   Continuous Infusions: . dextrose 5 % and 0.9% NaCl 20 mL/hr at 12/02/13 0451    Principal Problem:   Stroke Active Problems:   Hypertension   Type II or unspecified type diabetes mellitus with unspecified complication, uncontrolled   ESRD on dialysis   CVA (cerebral infarction)   COPD (chronic obstructive pulmonary disease)    Time spent: 35min    Jerald KiefStephen K Adhira Jamil  [facility] Pager [phone]. If 7PM-7AM, please contact night-coverage at [email or web address], password [email or web address] 12/02/2013, 11:57 AM  LOS: 1 day

## 2013-12-02 NOTE — Evaluation (Addendum)
Clinical/Bedside Swallow Evaluation Patient Details  Name: Stacy Odonnell MRN: [id number] Date of Birth: 02-05-1919  Today's Date: 12/02/2013 Time: [id number] SLP Time Calculation (min): 15 min  Past Medical History:  Past Medical History  Diagnosis Date  . Stroke   . Diabetes mellitus without complication   . Hypertension   . Cancer   . Dialysis patient   . Emphysema lung   . Renal failure    Past Surgical History:  Past Surgical History  Procedure Laterality Date  . Abdominal hysterectomy    . Cholecystectomy    . Tonsillectomy    . Joint replacement    . Shoulder surgery     HPI:  Stacy Odonnell is an 80 y.o. female with a history of. diabetes mellitus, stroke,  hypertension and lipidemia who developed acute onset of slurred speech.  She arrived in the emergency room for evaluation at 3 PM. She was beyond time window for consideration for treatment with TPA. CT scan of her head showed acute/subacute area of reduced attenuation involving the left posterior parietal region, in addition to old strokes involving right parietal and left occipital areas. Patient's speech has improved but is not back to normal per family. She's been taking aspirin daily. NIH stroke score was 3.   Assessment / Plan / Recommendation Clinical Impression  BSE completed per Stroke Protocol.  Indicates min to moderate sensory motor oral dysphagia with suspected pharyngeal dysphagia.   Impaired mastication due to weakness.  Poor bolus cohesion with soft and regular solids.  Suspected pharyngeal dysphagia characterized by slight delay in initiation.  Multiple swallows required per cup sip of water indicating pharyngeal weakness.  No outward clinical s/s of aspiration noted but cannot discern silent aspiration with subjective evaluation.   Recommend to proceed with dysphagia 2 (finely chopped) diet consistency with thin liquids.  Recommend set up with full supervision with all meals for safety.  ST to continue in  Acute Care Setting for diet tolerance and monitor closely for aspiration.  Completion of objective evaluation to be determined.      Aspiration Risk  Mild    Diet Recommendation Dysphagia 2 (Fine chop);Thin liquid   Liquid Administration via: Cup;No straw Medication Administration: Crushed with puree Supervision: Patient able to self feed;Staff to assist with self feeding;Full supervision/cueing for compensatory strategies Compensations: Slow rate;Small sips/bites;Check for pocketing Postural Changes and/or Swallow Maneuvers: Seated upright 90 degrees;Upright 30-60 min after meal    Other  Recommendations Oral Care Recommendations: Oral care Q4 per protocol Other Recommendations: Clarify dietary restrictions   Follow Up Recommendations  Home health SLP    Frequency and Duration min 2x/week  2 weeks       SLP Swallow Goals Please refer to Care Plans for listed goals   Swallow Study Prior Functional Status  Type of Home: House Available Help at Discharge: Family;Available 24 hours/day    General Date of Onset: 12/01/13 HPI:  Stacy DestineWheaton is an 80 y.o. female with a history of. diabetes mellitus, hypertension and lipidemia who developed acute onset of slurred speech at 11:30 AM today. Patient also has a history of 2 previous strokes. She arrived in the emergency room for evaluation at 3 PM. She was beyond time window for consideration for treatment with TPA. CT scan of her head showed acute/subacute area of reduced attenuation involving the left posterior parietal region, in addition to old strokes involving right parietal and left occipital areas. Patient's speech has improved but is not back to normal per family. She's been  taking aspirin daily. NIH stroke score was 3. Type of Study: Bedside swallow evaluation Diet Prior to this Study: NPO Respiratory Status: Room air History of Recent Intubation: No Behavior/Cognition: Alert;Cooperative;Pleasant mood Oral Cavity - Dentition:  Dentures, top Self-Feeding Abilities: Needs assist Patient Positioning: Upright in chair Baseline Vocal Quality: Clear Volitional Cough: Strong Volitional Swallow: Able to elicit    Oral/Motor/Sensory Function Overall Oral Motor/Sensory Function: Impaired Labial ROM: Reduced right Labial Symmetry: Abnormal symmetry right Labial Strength: Reduced Labial Sensation: Reduced Lingual ROM: Reduced right Lingual Symmetry: Abnormal symmetry right Lingual Strength: Reduced Lingual Sensation: Reduced Facial ROM: Reduced right Facial Symmetry: Right droop Facial Strength: Reduced Facial Sensation: Reduced Velum: Within Functional Limits Mandible: Within Functional Limits   Ice Chips Ice chips: Impaired Oral Phase Impairments: Impaired mastication Pharyngeal Phase Impairments: Suspected delayed Swallow;Decreased hyoid-laryngeal movement   Thin Liquid Thin Liquid: Impaired Presentation: Cup;Spoon Pharyngeal  Phase Impairments: Suspected delayed Swallow;Decreased hyoid-laryngeal movement;Multiple swallows    Nectar Thick Nectar Thick Liquid: Not tested   Honey Thick Honey Thick Liquid: Not tested   Puree Puree: Impaired Presentation: Self Fed Oral Phase Functional Implications: Prolonged oral transit;Oral residue Pharyngeal Phase Impairments: Suspected delayed Swallow;Decreased hyoid-laryngeal movement   Solid   GO    Solid: Impaired Oral Phase Impairments: Impaired mastication;Reduced lingual movement/coordination Pharyngeal Phase Impairments: Suspected delayed Swallow;Decreased hyoid-laryngeal movement      Stacy FowlerKaren Loistine Eberlin MS, CCC-SLP [phone] Stacy AxonKaren H Christiaan Odonnell 12/02/2013,4:22 PM

## 2013-12-02 NOTE — Consult Note (Signed)
[facility] Renal Consultation Note  Indication for Consultation:  Management of ESRD/hemodialysis; anemia, hypertension/volume and secondary hyperparathyroidism  HPI: Stacy Odonnell is a 80 y.o. AA female with a history of diabetes, hypertension, and two CVAs who had sudden onset yesterday of slurred speech and was taken to the ED for evaluation.  CT of the head showed an acute posterior left MCA infarct, as well as stable right MCA and left PCA infarcts.  She arrived at the hospital beyond the time window for treatment with tPA.  She denies any weakness or deficit, and her speech has improved since onset of symptoms, although she continues to have some expressive aphasia.  She was seen by Neurology, and MRI, MRA, 2-D echo, and carotid Dopplers are pending.  Information of her dialysis may be obtained tomorrow so that she may receive her scheduled treatment.  Dialysis Orders:  MWF @ [facility] @ [facility]       Other information to be obtained Monday 4/20 from [phone]  Past Medical History  Diagnosis Date  . Stroke   . Diabetes mellitus without complication   . Hypertension   . Cancer   . Dialysis patient   . Emphysema lung   . Renal failure    Past Surgical History  Procedure Laterality Date  . Abdominal hysterectomy    . Cholecystectomy    . Tonsillectomy    . Joint replacement    . Shoulder surgery     No family history on file.  Social History She quit smoking cigarettes, often a pack a day, around 30 years ago.  She occasionally has a glass of wine, but denies any excessive alcohol use.  She is originally from [place] and lived most of her life in [place], [place], but currently lives with her daughter in [place].  Allergies  Allergen Reactions  . Codeine   . Levaquin [Levofloxacin In D5w]   . Vicodin [Hydrocodone-Acetaminophen]     Confused, hypoxia  . Vistaril [Hydroxyzine Hcl]    Prior to Admission medications   Medication Sig Start  Date End Date Taking? Authorizing Provider  acetaminophen (TYLENOL) 650 MG CR tablet Take 650 mg by mouth every 8 (eight) hours as needed for pain.   Yes Historical Provider, MD  albuterol (PROVENTIL HFA;VENTOLIN HFA) 108 (90 BASE) MCG/ACT inhaler Inhale 1 puff into the lungs every 6 (six) hours as needed for wheezing or shortness of breath.   Yes Historical Provider, MD  albuterol (PROVENTIL) (2.5 MG/3ML) 0.083% nebulizer solution Take 2.5 mg by nebulization every 6 (six) hours as needed for wheezing or shortness of breath.   Yes Historical Provider, MD  allopurinol (ZYLOPRIM) 100 MG tablet Take 100 mg by mouth daily.   Yes Historical Provider, MD  aspirin 81 MG tablet Take 81 mg by mouth daily.   Yes Historical Provider, MD  calcium carbonate (OS-CAL) 600 MG TABS tablet Take 600 mg by mouth 2 (two) times daily with a meal. With D   Yes Historical Provider, MD  clotrimazole (LOTRIMIN) 1 % cream Apply 1 application topically.   Yes Historical Provider, MD  cyclobenzaprine (FLEXERIL) 5 MG tablet Take 5 mg by mouth as needed for muscle spasms.   Yes Historical Provider, MD  docusate sodium (COLACE) 100 MG capsule Take 100 mg by mouth 2 (two) times daily.   Yes Historical Provider, MD  furosemide (LASIX) 40 MG tablet Take 40 mg by mouth 2 (two) times daily.   Yes Historical Provider, MD  gabapentin (NEURONTIN) 300 MG capsule Take 300 mg by mouth at bedtime.   Yes Historical Provider, MD  insulin glargine (LANTUS) 100 UNIT/ML injection Inject into the skin at bedtime. 10-30 units   Yes Historical Provider, MD  lactobacillus acidophilus (BACID) TABS tablet Take 1 tablet by mouth 3 (three) times daily.   Yes Historical Provider, MD  omega-3 acid ethyl esters (LOVAZA) 1 G capsule Take 1,500 mg by mouth 2 (two) times daily. Omega 3,6,9   Yes Historical Provider, MD  omeprazole (PRILOSEC) 20 MG capsule Take 20 mg by mouth 2 (two) times daily before a meal.   Yes Historical Provider, MD  sevelamer carbonate  (RENVELA) 800 MG tablet Take 800 mg by mouth 3 (three) times daily with meals. Once with snack   Yes Historical Provider, MD  simvastatin (ZOCOR) 40 MG tablet Take 40 mg by mouth daily.   Yes Historical Provider, MD  tiotropium (SPIRIVA) 18 MCG inhalation capsule Place 18 mcg into inhaler and inhale daily.   Yes Historical Provider, MD   Labs:  Results for orders placed during the hospital encounter of 12/01/13 (from the past 48 hour(s))  CBG MONITORING, ED     Status: Abnormal   Collection Time    12/01/13  1:55 PM      Result Value Ref Range   Glucose-Capillary 147 (*) 70 - 99 mg/dL  ETHANOL     Status: None   Collection Time    12/01/13  3:05 PM      Result Value Ref Range   Alcohol, Ethyl (B) <11  0 - 11 mg/dL   Comment:            LOWEST DETECTABLE LIMIT FOR     SERUM ALCOHOL IS 11 mg/dL     FOR MEDICAL PURPOSES ONLY  PROTIME-INR     Status: None   Collection Time    12/01/13  3:05 PM      Result Value Ref Range   Prothrombin Time 14.4  11.6 - 15.2 seconds   INR 1.14  0.00 - 1.49  APTT     Status: Abnormal   Collection Time    12/01/13  3:05 PM      Result Value Ref Range   aPTT 39 (*) 24 - 37 seconds   Comment:            IF BASELINE aPTT IS ELEVATED,     SUGGEST PATIENT RISK ASSESSMENT     BE USED TO DETERMINE APPROPRIATE     ANTICOAGULANT THERAPY.  CBC     Status: Abnormal   Collection Time    12/01/13  3:05 PM      Result Value Ref Range   WBC 5.4  4.0 - 10.5 K/uL   RBC 4.87  3.87 - 5.11 MIL/uL   Hemoglobin 11.9 (*) 12.0 - 15.0 g/dL   HCT 35.8 (*) 36.0 - 46.0 %   MCV 73.5 (*) 78.0 - 100.0 fL   MCH 24.4 (*) 26.0 - 34.0 pg   MCHC 33.2  30.0 - 36.0 g/dL   RDW 17.2 (*) 11.5 - 15.5 %   Platelets 167  150 - 400 K/uL  DIFFERENTIAL     Status: Abnormal   Collection Time    12/01/13  3:05 PM      Result Value Ref Range   Neutrophils Relative % 47  43 - 77 %   Neutro Abs 2.6  1.7 - 7.7 K/uL   Lymphocytes Relative 34  12 -  46 %   Lymphs Abs 1.8  0.7 - 4.0 K/uL    Monocytes Relative 16 (*) 3 - 12 %   Monocytes Absolute 0.9  0.1 - 1.0 K/uL   Eosinophils Relative 2  0 - 5 %   Eosinophils Absolute 0.1  0.0 - 0.7 K/uL   Basophils Relative 0  0 - 1 %   Basophils Absolute 0.0  0.0 - 0.1 K/uL  COMPREHENSIVE METABOLIC PANEL     Status: Abnormal   Collection Time    12/01/13  3:05 PM      Result Value Ref Range   Sodium 140  137 - 147 mEq/L   Potassium 4.4  3.7 - 5.3 mEq/L   Chloride 97  96 - 112 mEq/L   CO2 26  19 - 32 mEq/L   Glucose, Bld 164 (*) 70 - 99 mg/dL   BUN 27 (*) 6 - 23 mg/dL   Creatinine, Ser 4.30 (*) 0.50 - 1.10 mg/dL   Calcium 9.9  8.4 - 10.5 mg/dL   Total Protein 8.2  6.0 - 8.3 g/dL   Albumin 3.6  3.5 - 5.2 g/dL   AST 41 (*) 0 - 37 U/L   ALT 30  0 - 35 U/L   Alkaline Phosphatase 369 (*) 39 - 117 U/L   Total Bilirubin 0.5  0.3 - 1.2 mg/dL   GFR calc non Af Amer 8 (*) >90 mL/min   GFR calc Af Amer 9 (*) >90 mL/min   Comment: (NOTE)     The eGFR has been calculated using the CKD EPI equation.     This calculation has not been validated in all clinical situations.     eGFR's persistently <90 mL/min signify possible Chronic Kidney     Disease.  TROPONIN I     Status: None   Collection Time    12/01/13  3:05 PM      Result Value Ref Range   Troponin I <0.30  <0.30 ng/mL   Comment:            Due to the release kinetics of cTnI,     a negative result within the first hours     of the onset of symptoms does not rule out     myocardial infarction with certainty.     If myocardial infarction is still suspected,     repeat the test at appropriate intervals.  GLUCOSE, CAPILLARY     Status: Abnormal   Collection Time    12/01/13  8:33 PM      Result Value Ref Range   Glucose-Capillary 106 (*) 70 - 99 mg/dL   Comment 1 Notify RN     Comment 2 Documented in Chart    GLUCOSE, CAPILLARY     Status: None   Collection Time    12/01/13 11:58 PM      Result Value Ref Range   Glucose-Capillary 90  70 - 99 mg/dL   Comment 1 Notify RN      Comment 2 Documented in Chart    GLUCOSE, CAPILLARY     Status: None   Collection Time    12/02/13  4:25 AM      Result Value Ref Range   Glucose-Capillary 77  70 - 99 mg/dL   Comment 1 Notify RN     Comment 2 Documented in Chart    LIPID PANEL     Status: None   Collection Time    12/02/13  6:25 AM  Result Value Ref Range   Cholesterol 161  0 - 200 mg/dL   Triglycerides 77  <150 mg/dL   HDL 108  >39 mg/dL   Total CHOL/HDL Ratio 1.5     VLDL 15  0 - 40 mg/dL   LDL Cholesterol 38  0 - 99 mg/dL   Comment:            Total Cholesterol/HDL:CHD Risk     Coronary Heart Disease Risk Table                         Men   Women      1/2 Average Risk   3.4   3.3      Average Risk       5.0   4.4      2 X Average Risk   9.6   7.1      3 X Average Risk  23.4   11.0                Use the calculated Patient Ratio     above and the CHD Risk Table     to determine the patient's CHD Risk.                ATP III CLASSIFICATION (LDL):      <100     mg/dL   Optimal      100-129  mg/dL   Near or Above                        Optimal      130-159  mg/dL   Borderline      160-189  mg/dL   High      >190     mg/dL   Very High  GLUCOSE, CAPILLARY     Status: None   Collection Time    12/02/13  8:11 AM      Result Value Ref Range   Glucose-Capillary 87  70 - 99 mg/dL   Constitutional: negative for chills, fatigue, fevers and sweats Ears, nose, mouth, throat, and face: negative for earaches, hoarseness, nasal congestion and sore throat Respiratory: negative for cough, dyspnea on exertion, hemoptysis and sputum Cardiovascular: negative for chest pain, chest pressure/discomfort, dyspnea, orthopnea and palpitations Gastrointestinal: negative for abdominal pain, change in bowel habits, nausea and vomiting Genitourinary:negative, anuric Musculoskeletal:negative for arthralgias, back pain, myalgias and neck pain Neurological: positive for speech problems; negative for dizziness, headaches,  paresthesia and weakness  Physical Exam: Filed Vitals:   12/02/13 0942  BP: 137/67  Pulse: 58  Temp: 98 F (36.7 C)  Resp: 16     General appearance: alert, cooperative and no distress Head: Normocephalic, without obvious abnormality, atraumatic Neck: no adenopathy, no carotid bruit, no JVD and supple, symmetrical, trachea midline Resp: clear to auscultation bilaterally Cardio: RRR with Gr II/VI systolic murmur, no rub GI: soft, non-tender; bowel sounds normal; no masses,  no organomegaly Extremities: extremities normal, atraumatic, no cyanosis or edema Neurologic: Grossly normal, alert & oriented, but with expressive aphasia Dialysis Access: AVF @ [facility] with + bruit   Assessment/Plan: 1. Acute CVA - CT showed acute posterior L MCA infarct, Hx 2 prior CVAs, on outpatient ASA.  MRI/MRA, carotid Doppler, echo pending per Neurology. 2. ESRD - HD on MWF @ [facility] in [place].  Obtain HD info tomorrow [phone]). 3. Hypertension/volume - BP 137/67, stable; wt 84 kg, no signs or symptoms of  fluid overload. 4. Anemia - Hgb 11.9, stable. 5. Metabolic bone disease - Ca 9.9 (10.2 corrected); outpatient meds pending. 6. Nutrition - Alb 3.6, currently NPO. 7. DM - BG stable, insulin per primary.  Ramiro Harvest 12/02/2013, 11:21 AM   Attending Nephrologist: Pearson Grippe, MD

## 2013-12-03 DIAGNOSIS — I6350 Cerebral infarction due to unspecified occlusion or stenosis of unspecified cerebral artery: Secondary | ICD-10-CM

## 2013-12-03 DIAGNOSIS — I10 Essential (primary) hypertension: Secondary | ICD-10-CM

## 2013-12-03 DIAGNOSIS — J4489 Other specified chronic obstructive pulmonary disease: Secondary | ICD-10-CM

## 2013-12-03 DIAGNOSIS — N186 End stage renal disease: Secondary | ICD-10-CM

## 2013-12-03 DIAGNOSIS — J449 Chronic obstructive pulmonary disease, unspecified: Secondary | ICD-10-CM

## 2013-12-03 DIAGNOSIS — I369 Nonrheumatic tricuspid valve disorder, unspecified: Secondary | ICD-10-CM

## 2013-12-03 LAB — GLUCOSE, CAPILLARY
GLUCOSE-CAPILLARY: 88 mg/dL (ref 70–99)
GLUCOSE-CAPILLARY: 135 mg/dL — AB (ref 70–99)
Glucose-Capillary: 182 mg/dL — ABNORMAL HIGH (ref 70–99)
Glucose-Capillary: 106 mg/dL — ABNORMAL HIGH (ref 70–99)

## 2013-12-03 MED ORDER — DOXERCALCIFEROL 4 MCG/2ML IV SOLN
INTRAVENOUS | Status: AC
  Administered 2013-12-03: 4 ug via INTRAVENOUS
  Filled 2013-12-03: qty 2

## 2013-12-03 MED ORDER — DOXERCALCIFEROL 4 MCG/2ML IV SOLN
4.0000 ug | INTRAVENOUS | Status: DC
  Administered 2013-12-03: 4 ug via INTRAVENOUS
  Filled 2013-12-03: qty 2

## 2013-12-03 MED ORDER — SODIUM CHLORIDE 0.9 % IV SOLN: 100.0000 mL | INTRAVENOUS | Status: DC | PRN

## 2013-12-03 MED ORDER — HEPARIN SODIUM (PORCINE) 1000 UNIT/ML DIALYSIS
20.0000 [IU]/kg | INTRAMUSCULAR | Status: DC | PRN
  Administered 2013-12-03: 1700 [IU] via INTRAVENOUS_CENTRAL
  Filled 2013-12-03: qty 2

## 2013-12-03 MED ORDER — HEPARIN SODIUM (PORCINE) 1000 UNIT/ML DIALYSIS
1000.0000 [IU] | INTRAMUSCULAR | Status: DC | PRN
  Filled 2013-12-03: qty 1

## 2013-12-03 MED ORDER — PENTAFLUOROPROP-TETRAFLUOROETH EX AERO: 1.0000 "application " | INHALATION_SPRAY | CUTANEOUS | Status: DC | PRN

## 2013-12-03 MED ORDER — PANTOPRAZOLE SODIUM 40 MG PO PACK
40.0000 mg | PACK | Freq: Every day | ORAL | Status: DC
  Administered 2013-12-03: 40 mg via ORAL
  Filled 2013-12-03 (×2): qty 20

## 2013-12-03 MED ORDER — NEPRO/CARBSTEADY PO LIQD
237.0000 mL | ORAL | Status: DC | PRN
  Filled 2013-12-03: qty 237

## 2013-12-03 MED ORDER — LIDOCAINE HCL (PF) 1 % IJ SOLN: 5.0000 mL | INTRAMUSCULAR | Status: DC | PRN

## 2013-12-03 MED ORDER — LIDOCAINE-PRILOCAINE 2.5-2.5 % EX CREA
1.0000 "application " | TOPICAL_CREAM | CUTANEOUS | Status: DC | PRN
  Filled 2013-12-03: qty 5

## 2013-12-03 MED ORDER — ALTEPLASE 2 MG IJ SOLR
2.0000 mg | Freq: Once | INTRAMUSCULAR | Status: AC | PRN
  Filled 2013-12-03: qty 2

## 2013-12-03 NOTE — Consult Note (Signed)
Physical Medicine and Rehabilitation Consult Reason for Consult: CVA Referring Physician: [facility]   HPI: Stacy Odonnell is a 80 y.o. right-handed female with history of diabetes mellitus, hypertension, CVA x2 and end-stage renal disease with hemodialysis. Patient independent with a walker living with her daughter and assistance as needed prior to admission. Admitted 12/01/2013 with slurred speech. Cranial CT scan showed acute versus subacute area of reduced attenuation involving the left posterior parietal region in addition to old infarcts involving right parietal and left occipital lobes. Echocardiogram with ejection fraction of 60%. Carotid Dopplers no ICA stenosis. MRI of the brain showed acute posterior division left MCA infarct as well as petechial hemorrhage without mass effect. MRA of the brain without stenosis or occlusion. Neurology services consulted placed on aspirin for CVA prophylaxis. Hemodialysis ongoing as per renal services. Dysphagia 2 liquid diet. Physical and occupational therapy evaluations completed. Occupational therapy has recommended physical medicine rehabilitation consult.   Review of Systems  HENT: Positive for hearing loss.   Respiratory: Positive for shortness of breath.   Gastrointestinal:       GERD  Musculoskeletal: Positive for myalgias.  All other systems reviewed and are negative.  Past Medical History  Diagnosis Date  . Stroke   . Diabetes mellitus without complication   . Hypertension   . Cancer   . Dialysis patient   . Emphysema lung   . Renal failure    Past Surgical History  Procedure Laterality Date  . Abdominal hysterectomy    . Cholecystectomy    . Tonsillectomy    . Joint replacement    . Shoulder surgery     No family history on file. Social History:  reports that she has quit smoking. She does not have any smokeless tobacco history on file. Her alcohol and drug histories are not on file. Allergies:  Allergies  Allergen  Reactions  . Codeine Nausea And Vomiting  . Levaquin [Levofloxacin In D5w] Other (See Comments)    Out of it  . Vicodin [Hydrocodone-Acetaminophen]     Confused, hypoxia  . Vistaril [Hydroxyzine Hcl] Other (See Comments)    unknown   Medications Prior to Admission  Medication Sig Dispense Refill  . acetaminophen (TYLENOL) 650 MG CR tablet Take 650 mg by mouth every 8 (eight) hours as needed for pain.      Marland Kitchen albuterol (PROVENTIL HFA;VENTOLIN HFA) 108 (90 BASE) MCG/ACT inhaler Inhale 1 puff into the lungs every 6 (six) hours as needed for wheezing or shortness of breath.      Marland Kitchen albuterol (PROVENTIL) (2.5 MG/3ML) 0.083% nebulizer solution Take 2.5 mg by nebulization every 6 (six) hours as needed for wheezing or shortness of breath.      . allopurinol (ZYLOPRIM) 100 MG tablet Take 100 mg by mouth daily.      Marland Kitchen aspirin 81 MG tablet Take 81 mg by mouth daily.      . calcium carbonate (OS-CAL) 600 MG TABS tablet Take 600 mg by mouth 2 (two) times daily with a meal. With D      . clotrimazole (LOTRIMIN) 1 % cream Apply 1 application topically daily as needed (rash).       Marland Kitchen docusate sodium (COLACE) 100 MG capsule Take 100 mg by mouth 2 (two) times daily as needed for mild constipation.       . furosemide (LASIX) 40 MG tablet Take 40 mg by mouth 2 (two) times daily.      Marland Kitchen gabapentin (NEURONTIN) 300 MG capsule  Take 300 mg by mouth at bedtime.      . insulin glargine (LANTUS) 100 UNIT/ML injection Inject 10-30 Units into the skin at bedtime. 10-30 units      . lactobacillus acidophilus (BACID) TABS tablet Take 1 tablet by mouth 3 (three) times daily.      . Omega-3 Fatty Acids (FISH OIL PO) Take 325 mg by mouth 2 (two) times daily.      Marland Kitchen omeprazole (PRILOSEC) 20 MG capsule Take 20 mg by mouth daily.       . sevelamer carbonate (RENVELA) 800 MG tablet Take 800 mg by mouth 3 (three) times daily with meals. Once with snack      . simvastatin (ZOCOR) 40 MG tablet Take 40 mg by mouth at bedtime.       Marland Kitchen  Specialty Vitamins Products (KIDNEY PO) Take 1 tablet by mouth daily. "Z" tablet  Kidney vitamin      . tiotropium (SPIRIVA) 18 MCG inhalation capsule Place 18 mcg into inhaler and inhale daily.        Home: Home Living Family/patient expects to be discharged to:: Private residence Living Arrangements: Children Available Help at Discharge: Family;Available 24 hours/day Type of Home: House Home Access: Stairs to enter [employer] of Steps: 1 Home Layout: One level Home Equipment: Walker - 2 wheels;Adaptive equipment Adaptive Equipment: Reacher;Sock aid Additional Comments: Pt severely HOH so difficult to accurately obtain info re: PLOF and home situation.  No family present  Lives With: Daughter  Functional History: Prior Function Level of Independence: Independent with assistive device(s) Comments: per chart review Functional Status:  Mobility: Bed Mobility Overal bed mobility: Needs Assistance Bed Mobility: Supine to Sit Supine to sit: Max assist General bed mobility comments: verbal cues for sequencing, sup to sit performed toward left side. Decreased assist may be required if transferring toward right. Transfers Overall transfer level: Needs assistance Equipment used: Rolling walker (2 wheeled) Transfers: Sit to/from [employer] Sit to Stand: Min assist Stand pivot transfers: Min assist General transfer comment: Pt with "bouncing"/possible tremors of bil. LEs when ambulating.  Thes decreased with tactile input Ambulation/Gait Ambulation/Gait assistance: Min assist Ambulation Distance (Feet): 15 Feet Assistive device: Rolling walker (2 wheeled) Gait Pattern/deviations: Decreased stride length;Steppage Gait velocity: decreased General Gait Details: buckling/trembling of LLE with initiation of swing phase    ADL: ADL Overall ADL's : Needs assistance/impaired Eating/Feeding: Modified independent;Sitting Grooming: Wash/dry hands;Wash/dry  face;Oral care;Brushing hair;Minimal assistance;Standing Grooming Details (indicate cue type and reason): min A for balance Upper Body Bathing: Set up;Sitting Lower Body Bathing: Moderate assistance;Sit to/from stand Upper Body Dressing : Set up;Supervision/safety;Sitting Lower Body Dressing: Moderate assistance;Sit to/from stand Lower Body Dressing Details (indicate cue type and reason): Pt uses AE at home Toilet Transfer: Minimal assistance;Comfort height toilet;Ambulation Toileting- Clothing Manipulation and Hygiene: Moderate assistance;Sit to/from stand Functional mobility during ADLs: Minimal assistance  Cognition: Cognition Overall Cognitive Status: Difficult to assess Arousal/Alertness: Awake/alert Orientation Level: Oriented X4 Attention: Sustained Sustained Attention: Impaired Sustained Attention Impairment: Functional basic;Verbal complex Memory: Impaired Memory Impairment: Decreased short term memory;Decreased recall of new information Decreased Short Term Memory: Verbal complex;Functional basic Awareness: Impaired Awareness Impairment: Emergent impairment;Anticipatory impairment Problem Solving: Impaired Problem Solving Impairment: Functional basic Executive Function: Sequencing;Reasoning;Organizing Reasoning: Impaired Reasoning Impairment: Verbal complex;Functional basic Sequencing: Impaired Sequencing Impairment: Verbal complex;Functional basic Organizing: Impaired Organizing Impairment: Verbal complex;Functional basic Cognition Arousal/Alertness: Awake/alert Behavior During Therapy: WFL for tasks assessed/performed Overall Cognitive Status: Difficult to assess Area of Impairment: Problem solving Problem Solving: Slow processing;Decreased  initiation;Difficulty sequencing;Requires verbal cues Difficult to assess due to: Hard of hearing/deaf;Impaired communication  Blood pressure 121/54, pulse 57, temperature 97.9 F (36.6 C), temperature source Oral, resp. rate  18, height 5\' 3"  (1.6 m), weight 189 lb 9.5 oz (86 kg), SpO2 96.00%. Physical Exam  Constitutional: She is oriented to person, place, and time. She appears well-developed and well-nourished.  HENT:  Head: Normocephalic.  Eyes: EOM are normal. Right eye exhibits no discharge. Left eye exhibits no discharge.  Neck: Normal range of motion. Neck supple. No JVD present. No tracheal deviation present. No thyromegaly present.  Cardiovascular: Normal rate and regular rhythm.   Respiratory: Effort normal and breath sounds normal. No respiratory distress.  GI: Soft. Bowel sounds are normal. She exhibits no distension.  Lymphadenopathy:    She has no cervical adenopathy.  Neurological: She is alert and oriented to person, place, and time. She displays normal reflexes. She exhibits normal muscle tone.  Makes good eye contact with examiner. Tongue is midline. She is hard of hearing. Speech mildly dysarthric but intelligible. She follows simple commands. Expressive language deficits. Receptive language appears intact. RUE 4 to 4+/5 LUE is 4+. Bilateral LE's are 4 to 4+/5 prox to distal. No gross sensory deficits.   Skin: Skin is warm and dry.  Psychiatric: She has a normal mood and affect. Her behavior is normal. Thought content normal.    Results for orders placed during the hospital encounter of 12/01/13 (from the past 24 hour(s))  GLUCOSE, CAPILLARY     Status: None   Collection Time    12/02/13  4:26 PM      Result Value Ref Range   Glucose-Capillary 93  70 - 99 mg/dL  GLUCOSE, CAPILLARY     Status: Abnormal   Collection Time    12/03/13 12:51 PM      Result Value Ref Range   Glucose-Capillary 135 (*) 70 - 99 mg/dL   Mr Brain Wo Contrast  12/02/2013   CLINICAL DATA:  80 year old female with acute onset slurred speech. Prior stroke. Posterior left MCA infarct detected on recent CT. Initial encounter.  EXAM: MRI HEAD WITHOUT CONTRAST  MRA HEAD WITHOUT CONTRAST  TECHNIQUE: Multiplanar, multiecho  pulse sequences of the brain and surrounding structures were obtained without intravenous contrast. Angiographic images of the head were obtained using MRA technique without contrast.  COMPARISON:  Head CT without contrast 12/01/2013. [facility] brain MRI 01/25/2011.  FINDINGS: MRI HEAD FINDINGS  Cortical and subcortical restricted diffusion in the posterior left MCA territory primarily affecting the parietal lobe, corresponding to the CT finding. There is some involvement of the posterior superior left temporal lobe. Associated gyral edema. Petechial hemorrhages present (series 9, image 125). No significant mass effect.  No contralateral right hemisphere or posterior fossa restricted diffusion. Major intracranial vascular flow voids are preserved.  Chronic hemorrhagic posterior right MCA territory infarct. Superimposed more mild for age cerebral white matter T2 and FLAIR hyperintensity. Deep gray matter nuclei, brainstem and cerebellum within normal limits for age. Visible internal auditory structures appear normal. Mastoids are clear. No midline shift or ventriculomegaly. Negative pituitary, cervicomedullary junction and visualized cervical spine. Normal bone marrow signal. Chronic left sphenoid sinusitis. Stable paranasal sinuses. Postoperative changes to the globes. Visualized scalp soft tissues are within normal limits.  MRA HEAD FINDINGS  Antegrade flow in the posterior circulation with codominant distal vertebral arteries. Patent vertebrobasilar junction. Patent left PICA origin. Patent AICA origins. No basilar stenosis. SCA and PCA origins are normal. Posterior  communicating arteries are diminutive or absent. Bilateral PCA branches are within normal limits.  Antegrade flow in both ICA siphons. Atherosclerosis resulting in mild bilateral ICA stenosis, worse on the right. Ophthalmic artery origins within normal limits.  Carotid termini are patent. MCA and ACA origins are patent. Anterior  communicating artery and visualized ACA branches are within normal limits. Visualized right MCA branches are within normal limits.  Left MCA M1 segment is patent. Visualized left MCA branches are within normal limits.  IMPRESSION: 1. Acute posterior division left MCA infarct, corresponding to the recent CT finding. Positive for petechial hemorrhage but no mass effect or malignant hemorrhagic transformation at this time. 2. Negative for age intracranial MRA. No left MCA branch occlusion or hemodynamically significant anterior circulation stenosis identified. 3. Chronic right MCA infarct. Salient findings discussed by telephone with RN Deland PrettyMarissa Elizondo on 12/02/2013 at 13:56 .   Electronically Signed   By: Augusto GambleLee  Hall M.D.   On: 12/02/2013 13:58   Mr Maxine GlennMra Head/brain Wo Cm  12/02/2013   CLINICAL DATA:  80 year old female with acute onset slurred speech. Prior stroke. Posterior left MCA infarct detected on recent CT. Initial encounter.  EXAM: MRI HEAD WITHOUT CONTRAST  MRA HEAD WITHOUT CONTRAST  TECHNIQUE: Multiplanar, multiecho pulse sequences of the brain and surrounding structures were obtained without intravenous contrast. Angiographic images of the head were obtained using MRA technique without contrast.  COMPARISON:  Head CT without contrast 12/01/2013. [facility]/25/2011.  FINDINGS: MRI HEAD FINDINGS  Cortical and subcortical restricted diffusion in the posterior left MCA territory primarily affecting the parietal lobe, corresponding to the CT finding. There is some involvement of the posterior superior left temporal lobe. Associated gyral edema. Petechial hemorrhages present (series 9, image 125). No significant mass effect.  No contralateral right hemisphere or posterior fossa restricted diffusion. Major intracranial vascular flow voids are preserved.  Chronic hemorrhagic posterior right MCA territory infarct. Superimposed more mild for age cerebral white matter T2 and FLAIR  hyperintensity. Deep gray matter nuclei, brainstem and cerebellum within normal limits for age. Visible internal auditory structures appear normal. Mastoids are clear. No midline shift or ventriculomegaly. Negative pituitary, cervicomedullary junction and visualized cervical spine. Normal bone marrow signal. Chronic left sphenoid sinusitis. Stable paranasal sinuses. Postoperative changes to the globes. Visualized scalp soft tissues are within normal limits.  MRA HEAD FINDINGS  Antegrade flow in the posterior circulation with codominant distal vertebral arteries. Patent vertebrobasilar junction. Patent left PICA origin. Patent AICA origins. No basilar stenosis. SCA and PCA origins are normal. Posterior communicating arteries are diminutive or absent. Bilateral PCA branches are within normal limits.  Antegrade flow in both ICA siphons. Atherosclerosis resulting in mild bilateral ICA stenosis, worse on the right. Ophthalmic artery origins within normal limits.  Carotid termini are patent. MCA and ACA origins are patent. Anterior communicating artery and visualized ACA branches are within normal limits. Visualized right MCA branches are within normal limits.  Left MCA M1 segment is patent. Visualized left MCA branches are within normal limits.  IMPRESSION: 1. Acute posterior division left MCA infarct, corresponding to the recent CT finding. Positive for petechial hemorrhage but no mass effect or malignant hemorrhagic transformation at this time. 2. Negative for age intracranial MRA. No left MCA branch occlusion or hemodynamically significant anterior circulation stenosis identified. 3. Chronic right MCA infarct. Salient findings discussed by telephone with RN Deland PrettyMarissa Elizondo on 12/02/2013 at 13:56 .   Electronically Signed   By: Si GaulLee  Hall M.D.  On: 12/02/2013 13:58    Assessment/Plan: Diagnosis: left MCA infarct 1. Does the need for close, 24 hr/day medical supervision in concert with the patient's rehab needs  make it unreasonable for this patient to be served in a less intensive setting? Potentially 2. Co-Morbidities requiring supervision/potential complications: htn, ESRD, COPD 3. Due to bladder management, bowel management, safety, skin/wound care, disease management, medication administration and patient education, does the patient require 24 hr/day rehab nursing? Potentially 4. Does the patient require coordinated care of a physician, rehab nurse, PT (1-2 hrs/day, 5 days/week), OT (1-2 hrs/day, 5 days/week) and SLP (1-2 hrs/day, 5 days/week) to address physical and functional deficits in the context of the above medical diagnosis(es)? Potentially Addressing deficits in the following areas: balance, endurance, locomotion, strength, transferring, bowel/bladder control, bathing, dressing, feeding, grooming, toileting, cognition and language 5. Can the patient actively participate in an intensive therapy program of at least 3 hrs of therapy per day at least 5 days per week? Yes 6. The potential for patient to make measurable gains while on inpatient rehab is good and fair 7. Anticipated functional outcomes upon discharge from inpatient rehab are modified independent and supervision  with PT, modified independent and supervision with OT, supervision with SLP. 8. Estimated rehab length of stay to reach the above functional goals is: 7 days 9. Does the patient have adequate social supports to accommodate these discharge functional goals? Yes and Potentially 10. Anticipated D/C setting: Home 11. Anticipated post D/C treatments: HH therapy and Outpatient therapy 12. Overall Rehab/Functional Prognosis: excellent  RECOMMENDATIONS: This patient's condition is appropriate for continued rehabilitative care in the following setting: CIR Patient has agreed to participate in recommended program. No and Potentially Note that insurance prior authorization may be required for reimbursement for recommended  care.  Comment: Pt would like to speak with daughter. She prefers to go home if possible. Rehab Admissions Coordinator to follow up.  Thanks,  Ranelle OysterZachary T. Swartz, MD, [place] DomFAAPMR     12/03/2013

## 2013-12-03 NOTE — Progress Notes (Signed)
UR complete.  Alga Southall RN, MSN 

## 2013-12-03 NOTE — Progress Notes (Signed)
Echo Lab  2D Echocardiogram completed.  Nayomi Tabron L Madeliene Tejera, RDCS 12/03/2013 11:59 AM   

## 2013-12-03 NOTE — Progress Notes (Signed)
Stroke Team Progress Note  HISTORY Stacy Odonnell is a 80 y.o. female with a history of. diabetes mellitus, hypertension and lipidemia who developed acute onset of slurred speech at 11:30 AM on 12/01/2013. Patient also has a history of 2 previous strokes. She arrived in the emergency room for evaluation at 3 PM. She was beyond time window for consideration for treatment with TPA. CT scan of her head showed acute/subacute area of reduced attenuation involving the left posterior parietal region, in addition to old strokes involving right parietal and left occipital areas. Patient's speech has improved but is not back to normal per family. She's been taking aspirin daily. NIH stroke score was 3.   SUBJECTIVE Her RN is at the bedside. No family present.  OBJECTIVE Most recent Vital Signs: Filed Vitals:   12/02/13 2134 12/03/13 0222 12/03/13 0456 12/03/13 1016  BP: 137/49 110/40 113/60 125/58  Pulse: 78 55 62 44  Temp: 98.6 F (37 C) 98.3 F (36.8 C) 98.1 F (36.7 C) 97.8 F (36.6 C)  TempSrc: Oral Oral Oral Oral  Resp: 20 18 18 18   Height:      Weight:      SpO2: 96% 94% 95% 95%   CBG (last 3)   Recent Labs  12/02/13 0811 12/02/13 1208 12/02/13 1626  GLUCAP 87 91 93   MEDICATIONS  . allopurinol  100 mg Oral Daily  . aspirin  300 mg Rectal Daily   Or  . aspirin  325 mg Oral Daily  . gabapentin  300 mg Oral QHS  . insulin aspart  0-9 Units Subcutaneous TID WC  . pantoprazole sodium  40 mg Oral QHS   PRN:  acetaminophen, acetaminophen, albuterol  Diet:  Dysphagia 2 thin liquids Activity:  OOB with assistance DVT Prophylaxis:  SCDs  CLINICALLY SIGNIFICANT STUDIES Basic Metabolic Panel:   Recent Labs Lab 12/01/13 1505  NA 140  K 4.4  CL 97  CO2 26  GLUCOSE 164*  BUN 27*  CREATININE 4.30*  CALCIUM 9.9   Liver Function Tests:   Recent Labs Lab 12/01/13 1505  AST 41*  ALT 30  ALKPHOS 369*  BILITOT 0.5  PROT 8.2  ALBUMIN 3.6   CBC:   Recent Labs Lab  12/01/13 1505  WBC 5.4  NEUTROABS 2.6  HGB 11.9*  HCT 35.8*  MCV 73.5*  PLT 167   Coagulation:   Recent Labs Lab 12/01/13 1505  LABPROT 14.4  INR 1.14   Cardiac Enzymes:   Recent Labs Lab 12/01/13 1505  TROPONINI <0.30   Urinalysis: No results found for this basename: COLORURINE, APPERANCEUR, LABSPEC, PHURINE, GLUCOSEU, HGBUR, BILIRUBINUR, KETONESUR, PROTEINUR, UROBILINOGEN, NITRITE, LEUKOCYTESUR,  in the last 168 hours  Lipid Panel     Component Value Date/Time   CHOL 161 12/02/2013 0625   TRIG 77 12/02/2013 0625   HDL 108 12/02/2013 0625   CHOLHDL 1.5 12/02/2013 0625   VLDL 15 12/02/2013 0625   LDLCALC 38 12/02/2013 0625   HgbA1C  Lab Results  Component Value Date   HGBA1C 6.6* 12/02/2013    Urine Drug Screen:   No results found for this basename: labopia,  cocainscrnur,  labbenz,  amphetmu,  thcu,  labbarb    Alcohol Level:   Recent Labs Lab 12/01/13 1505  ETH <11    CT Head 12/01/2013   1. Acute/subacute posterior left MCA infarct. No associated mass effect or hemorrhage at this time. 2. Stable chronic right MCA and left PCA infarcts.   MRI of the brain  12/02/2013    1. Acute posterior division left MCA infarct, corresponding to the recent CT finding. Positive for petechial hemorrhage but no mass effect or malignant hemorrhagic transformation at this time. Chronic right MCA infarct.   MRA of the brain  12/02/2013    Negative for age intracranial MRA. No left MCA branch occlusion or hemodynamically significant anterior circulation stenosis identified.   2D Echocardiogram    Carotid Doppler  No evidence of hemodynamically significant internal carotid artery stenosis. Vertebral artery flow is antegrade.   EKG atrial fibrillation rate 62 beats per minute.  For complete results please see formal report.   Therapy Recommendations SNF  Physical Exam   Neurologic Examination:  Mental Status:  Alert, oriented, thought content appropriate. Speech slightly  slurred with some mild word finding difficulty and nonfluency. Able to follow commands without difficulty.  Cranial Nerves:  II-slight difficulty with finger counting involving right visual field.  III/IV/VI-Pupils were equal and reacted. Extraocular movements were full and conjugate.  V/VII-no facial numbness and no facial weakness.  VIII-normal.  X-slight dysarthria; symmetrical palatal movement.  Motor: 5/5 bilaterally with normal tone and bulk  Sensory: Normal throughout.  Deep Tendon Reflexes: Trace to 1+ and symmetric.  Plantars: Flexor bilaterally  Cerebellar: Normal finger-to-nose testing.  ASSESSMENT Ms. Stacy MaffucciMarie Odonnell is a 80 y.o. female presenting with acute onset of speech difficulties. TPA was not administered as the patient was beyond the window for treatment. Imaging confirms an acute/subacute posterior left MCA infarct with petechial hemorrhagic transformation. Infarct felt to be embolic secondary to atrial fibrillation. On aspirin 81 mg orally every day prior to admission. Now on aspirin 325 mg orally every day for secondary stroke prevention. Patient with resultant dysarthria with mild aphasia. Stroke work up underway.   Atrial fibrillation  Hypertension Diabetes, HgbA1c 6.6, goal < 7.0  Previous strokes  COPD  Endstage renal disease - on dialysis [facility]   Mildly elevated liver function tests    Hospital day # 2  TREATMENT/PLAN  Continue aspirin 325 mg orally every day for secondary stroke prevention. Has not been an anticoagulation candidate in the past/now due to age and risk of fall.  Dr. Pearlean BrownieSethi has discussed with family to consider anticoagulation - family to discuss [place] [facility] as she has a hx of bleeding on HD  Await 2-D echo - it is not anticipated results will alter treatment.  SNF recommended for discharge dispo  Nothing further to add from the stroke standpoint Patient has a 10-15% risk of having another stroke over  the next year, the highest risk is within 2 weeks of the most recent stroke/TIA (risk of having a stroke following a stroke or TIA is the same). Ongoing risk factor control by Primary Care Physician Stroke Service will sign off. Please call should any needs arise. Follow up with Dr. Pearlean BrownieSethi, Stroke Clinic, in 2 months.  Annie MainSHARON BIBY, MSN, RN, ANVP-BC, ANP-BC, Lawernce IonGNP-BC [facility] Pager: [phone] 12/03/2013 10:20 AM  I have personally obtained a history, examined the patient, evaluated imaging results, and formulated the assessment and plan of care. I agree with the above. Delia HeadyPramod Danyael Alipio, MD  To contact Stroke Continuity provider, please refer to [email or web address]. After hours, contact General Neurology

## 2013-12-03 NOTE — Procedures (Signed)
Patient was seen on dialysis and the procedure was supervised. BFR 350 [address] AVF BP is 120/48.  Patient appears to be tolerating treatment well.  Will have her and her daughter discuss longterm anticoagulation with her primary nephrologist after discharge, however she remains a high risk as she already had an embolic CVA without coumadin.

## 2013-12-03 NOTE — Progress Notes (Signed)
Pt c/o intermittent blurred vision in both eyes, and reports that this is new.  Will notify MD.

## 2013-12-03 NOTE — Progress Notes (Signed)
Case Management: Copy of insurance card is in the shadow chart.

## 2013-12-03 NOTE — Progress Notes (Signed)
Rehab Admissions Coordinator Note:  Patient was screened by Clois DupesBarbara Godwin Patrizia Paule for appropriateness for an Inpatient Acute Rehab Consult per OT recommendation. Noted PT recommends SNF. At this time, we are recommending Inpatient Rehab consult. Please order an inpt rehab consult if you feel appropriate.  Foye SpurlingBarbara Godwin [facility] 12/03/2013, 2:45 PM  I can be reached at [phone].

## 2013-12-03 NOTE — Care Management Note (Unsigned)
    Page 1 of 2   12/04/2013     11:47:32 AM CARE MANAGEMENT NOTE 12/04/2013  Patient:  Stacy Odonnell,Stacy Odonnell   Account Number:  [id number]  Date Initiated:  12/03/2013  Documentation initiated by:  Elmer BalesOBARGE,COURTNEY  Subjective/Objective Assessment:   patient admitted with CVA. Lives at home with adult children.     Action/Plan:   Will follow for discharge needs pending PT/OT evals   Anticipated DC Date:     Anticipated DC Plan:  SKILLED NURSING FACILITY  In-house referral  Clinical Social Worker      DC Planning Services  CM consult      Choice offered to / List presented to:  C-4 Adult Children        HH arranged  HH-2 PT  HH-3 OT  HH-4 NURSE'S AIDE  HH-6 SOCIAL WORKER      HH agency  [facility]   Status of service:  Completed, signed off Medicare Important Message given?   (If response is "NO", the following Medicare IM given date fields will be blank) Date Medicare IM given:   Date Additional Medicare IM given:    Discharge Disposition:  HOME W HOME HEALTH SERVICES  Per UR Regulation:  Reviewed for med. necessity/level of care/duration of stay  If discussed at Long Length of Stay Meetings, dates discussed:    Comments:  12/03/13 1100 Elmer Balesourtney Robarge RN, MSN, CM- [facility]Mary [facility] for discharge today. Referral has been accepted  12/03/13 1430 Elmer Balesourtney Robarge RN, MSN, CM- Spoke with CSW, who states that patient's family prefers home with home health instead of SNF at discharge.  CM spoke with patient's daughter Stacy Odonnell via phone to confirm.  When home health orders are placed, [facility].  Patient is currently listed as self pay, but per daughter has [employer] of [place] Medicare. Daughter to bring insurance card, which CM will fax to admitting.  CM spoke with Dr Rhona Leavenshiu to notify of request for home health orders at discharge.  [facility] once orders are in  place..Marland Kitchen

## 2013-12-03 NOTE — Progress Notes (Signed)
Speech Language Pathology Treatment: Cognitive-Linquistic  Patient Details Name: Stacy Odonnell MRN: [id number] DOB: 12/19/1918 Today's Date: 12/03/2013 Time: [id number] SLP Time Calculation (min): 31 min  Assessment / Plan / Recommendation Clinical Impression  Verbal output facilitated by SLP providing moderate oral placement/visual and written cues.  Pt with motor planning deficits but compensates well by taking her time to articulate.  She was able to communicate in short phrases with intermittent initial sound repetition and pauses.    Pt's hearing loss is impacting her receptive skills currently - she reports waiting for daughter to bring batteries.   Pt benefited from maximum assistance *verbal, visual cues to locate call bell and call for assistance to use restroom. Again her hearing loss impacts call bell usage as she can not hear secretary answering call.    Pt is making progress re: use of compensation strategies for communication facilitation.   SLP provided written instructions for family to use to further facilitate pt's expressive communication skills.  Pt able to read short paragraph of tips with pauses notable on multi-syllabic words.    Pt denies difficulties swallowing and near empty breakfast tray at bedside. She politely declined to consume any po during our session.  Will continue to follow for communication and swallowing facilitation.  Pt agreeable to plan - no family present at this time.    HPI HPI: Stacy MaffucciMarie Baack is an 80 y.o. female with a history of. diabetes mellitus, stroke,  hypertension and lipidemia who developed acute onset of slurred speech.  She arrived in the emergency room for evaluation at 3 PM. She was beyond time window for consideration for treatment with TPA. CT scan of her head showed acute/subacute area of reduced attenuation involving the left posterior parietal region, in addition to old strokes involving right parietal and left occipital areas. Patient  being seen for dysphagia, cognitive linguistic treatment.     Pertinent Vitals Afebrile  SLP Plan  Continue with current plan of care    Recommendations                Oral Care Recommendations: Oral care Q4 per protocol Follow up Recommendations: Home health SLP Plan: Continue with current plan of care    GO     Mills Kolleramara Ann Alisabeth Selkirk Rhodie Cienfuegos, MS [facility] SLP [phone]

## 2013-12-03 NOTE — Progress Notes (Signed)
[facility] PROGRESS NOTE  Stacy Odonnell ZOX:[id number] DOB: December 11, 1918 DOA: 12/01/2013 PCP: No PCP Per Patient  Assessment/Plan: 1. Acute/subacute L MCA Infarct 1. Neurology following 2. MRI brain with acute L MCA infarct 3. 2D echo pending 4. Carotid dopplers unremarkable 5. PT/OT/SLP, thus far recs for SNF 6. LDL 38 2. HTN 1. BP stable and controlled 2. Cont current meds 3. DM 1. FSBS stable and controlled 2. Cont SSI coverage for now 3. Diabetic diet 4. COPD 1. Stable 2. On min O2 support 5. ESRD 1. Nephrology consulted for scheduled MWF HD 6. DVT prophylaxis 1. SCD's  Code Status: DNR Family Communication: Pt in room Disposition Plan: Pending poss SNF   Consultants:  Neurology  Procedures:    Antibiotics:  none  HPI/Subjective: No complaints. No acute events overnight  Objective: Filed Vitals:   12/02/13 2134 12/03/13 0222 12/03/13 0456 12/03/13 1016  BP: 137/49 110/40 113/60 125/58  Pulse: 78 55 62 44  Temp: 98.6 F (37 C) 98.3 F (36.8 C) 98.1 F (36.7 C) 97.8 F (36.6 C)  TempSrc: Oral Oral Oral Oral  Resp: 20 18 18 18   Height:      Weight:      SpO2: 96% 94% 95% 95%   No intake or output data in the 24 hours ending 12/03/13 1137 Filed Weights   12/01/13 1804  Weight: 83.961 kg (185 lb 1.6 oz)    Exam:   General:  Awake, in nad  Cardiovascular: regular, s1,s 2  Respiratory: normal resp effort, no wheezing  Abdomen: soft, nondistended  Musculoskeletal: perfused, no clubbing   Data Reviewed: Basic Metabolic Panel:  Recent Labs Lab 12/01/13 1505  NA 140  K 4.4  CL 97  CO2 26  GLUCOSE 164*  BUN 27*  CREATININE 4.30*  CALCIUM 9.9   Liver Function Tests:  Recent Labs Lab 12/01/13 1505  AST 41*  ALT 30  ALKPHOS 369*  BILITOT 0.5  PROT 8.2  ALBUMIN 3.6   No results found for this basename: LIPASE, AMYLASE,  in the last 168 hours No results found for this basename: AMMONIA,  in the last 168  hours CBC:  Recent Labs Lab 12/01/13 1505  WBC 5.4  NEUTROABS 2.6  HGB 11.9*  HCT 35.8*  MCV 73.5*  PLT 167   Cardiac Enzymes:  Recent Labs Lab 12/01/13 1505  TROPONINI <0.30   BNP (last 3 results) No results found for this basename: PROBNP,  in the last 8760 hours CBG:  Recent Labs Lab 12/01/13 2358 12/02/13 0425 12/02/13 0811 12/02/13 1208 12/02/13 1626  GLUCAP 90 77 87 91 93    No results found for this or any previous visit (from the past 240 hour(s)).   Studies: Ct Head Wo Contrast  12/01/2013   CLINICAL DATA:  80 year old female code stroke. Slurred speech. Initial encounter.  EXAM: CT HEAD WITHOUT CONTRAST  TECHNIQUE: Contiguous axial images were obtained from the base of the skull through the vertex without intravenous contrast.  COMPARISON:  08/22/2013 and earlier.  FINDINGS: Stable visualized osseous structures. Stable paranasal sinuses and mastoids. No acute orbit or scalp soft tissue findings.  Calcified atherosclerosis at the skull base. Cerebral volume is within normal limits for age. Chronic posterior right MCA infarct re- identified with encephalomalacia. Chronic small left PCA infarct affecting the left occipital pole re- identified and stable.  New cortically based hypodensity in the posterior left MCA territory. Series 2, image 23). Parietal lobe primarily affected. No associated mass effect or hemorrhage is  evident.  No suspicious intracranial vascular hyperdensity. Stable gray-white matter differentiation elsewhere. No ventriculomegaly. No midline shift, mass effect, or evidence of intracranial mass lesion.  IMPRESSION: 1. Acute/subacute posterior left MCA infarct. No associated mass effect or hemorrhage at this time. 2. Stable chronic right MCA and left PCA infarcts. Study discussed by telephone with Dr. Linwood DibblesJON KNAPP on 12/01/2013 at 14:34 .   Electronically Signed   By: Augusto GambleLee  Hall M.D.   On: 12/01/2013 14:34   Mr Brain Wo Contrast  12/02/2013   CLINICAL  DATA:  80 year old female with acute onset slurred speech. Prior stroke. Posterior left MCA infarct detected on recent CT. Initial encounter.  EXAM: MRI HEAD WITHOUT CONTRAST  MRA HEAD WITHOUT CONTRAST  TECHNIQUE: Multiplanar, multiecho pulse sequences of the brain and surrounding structures were obtained without intravenous contrast. Angiographic images of the head were obtained using MRA technique without contrast.  COMPARISON:  Head CT without contrast 12/01/2013. [facility] brain [facility] 01/25/2011.  FINDINGS: MRI HEAD FINDINGS  Cortical and subcortical restricted diffusion in the posterior left MCA territory primarily affecting the parietal lobe, corresponding to the CT finding. There is some involvement of the posterior superior left temporal lobe. Associated gyral edema. Petechial hemorrhages present (series 9, image 125). No significant mass effect.  No contralateral right hemisphere or posterior fossa restricted diffusion. Major intracranial vascular flow voids are preserved.  Chronic hemorrhagic posterior right MCA territory infarct. Superimposed more mild for age cerebral white matter T2 and FLAIR hyperintensity. Deep gray matter nuclei, brainstem and cerebellum within normal limits for age. Visible internal auditory structures appear normal. Mastoids are clear. No midline shift or ventriculomegaly. Negative pituitary, cervicomedullary junction and visualized cervical spine. Normal bone marrow signal. Chronic left sphenoid sinusitis. Stable paranasal sinuses. Postoperative changes to the globes. Visualized scalp soft tissues are within normal limits.  MRA HEAD FINDINGS  Antegrade flow in the posterior circulation with codominant distal vertebral arteries. Patent vertebrobasilar junction. Patent left PICA origin. Patent AICA origins. No basilar stenosis. SCA and PCA origins are normal. Posterior communicating arteries are diminutive or absent. Bilateral PCA branches are within normal limits.   Antegrade flow in both ICA siphons. Atherosclerosis resulting in mild bilateral ICA stenosis, worse on the right. Ophthalmic artery origins within normal limits.  Carotid termini are patent. MCA and ACA origins are patent. Anterior communicating artery and visualized ACA branches are within normal limits. Visualized right MCA branches are within normal limits.  Left MCA M1 segment is patent. Visualized left MCA branches are within normal limits.  IMPRESSION: 1. Acute posterior division left MCA infarct, corresponding to the recent CT finding. Positive for petechial hemorrhage but no mass effect or malignant hemorrhagic transformation at this time. 2. Negative for age intracranial MRA. No left MCA branch occlusion or hemodynamically significant anterior circulation stenosis identified. 3. Chronic right MCA infarct. Salient findings discussed by telephone with RN Deland PrettyMarissa Elizondo on 12/02/2013 at 13:56 .   Electronically Signed   By: Augusto GambleLee  Hall M.D.   On: 12/02/2013 13:58   Mr Maxine GlennMra Head/brain Wo Cm  12/02/2013   CLINICAL DATA:  80 year old female with acute onset slurred speech. Prior stroke. Posterior left MCA infarct detected on recent CT. Initial encounter.  EXAM: MRI HEAD WITHOUT CONTRAST  MRA HEAD WITHOUT CONTRAST  TECHNIQUE: Multiplanar, multiecho pulse sequences of the brain and surrounding structures were obtained without intravenous contrast. Angiographic images of the head were obtained using MRA technique without contrast.  COMPARISON:  Head CT without contrast 12/01/2013. [facility] brain MRI 01/25/2011.  FINDINGS: MRI HEAD FINDINGS  Cortical and subcortical restricted diffusion in the posterior left MCA territory primarily affecting the parietal lobe, corresponding to the CT finding. There is some involvement of the posterior superior left temporal lobe. Associated gyral edema. Petechial hemorrhages present (series 9, image 125). No significant mass effect.  No contralateral right  hemisphere or posterior fossa restricted diffusion. Major intracranial vascular flow voids are preserved.  Chronic hemorrhagic posterior right MCA territory infarct. Superimposed more mild for age cerebral white matter T2 and FLAIR hyperintensity. Deep gray matter nuclei, brainstem and cerebellum within normal limits for age. Visible internal auditory structures appear normal. Mastoids are clear. No midline shift or ventriculomegaly. Negative pituitary, cervicomedullary junction and visualized cervical spine. Normal bone marrow signal. Chronic left sphenoid sinusitis. Stable paranasal sinuses. Postoperative changes to the globes. Visualized scalp soft tissues are within normal limits.  MRA HEAD FINDINGS  Antegrade flow in the posterior circulation with codominant distal vertebral arteries. Patent vertebrobasilar junction. Patent left PICA origin. Patent AICA origins. No basilar stenosis. SCA and PCA origins are normal. Posterior communicating arteries are diminutive or absent. Bilateral PCA branches are within normal limits.  Antegrade flow in both ICA siphons. Atherosclerosis resulting in mild bilateral ICA stenosis, worse on the right. Ophthalmic artery origins within normal limits.  Carotid termini are patent. MCA and ACA origins are patent. Anterior communicating artery and visualized ACA branches are within normal limits. Visualized right MCA branches are within normal limits.  Left MCA M1 segment is patent. Visualized left MCA branches are within normal limits.  IMPRESSION: 1. Acute posterior division left MCA infarct, corresponding to the recent CT finding. Positive for petechial hemorrhage but no mass effect or malignant hemorrhagic transformation at this time. 2. Negative for age intracranial MRA. No left MCA branch occlusion or hemodynamically significant anterior circulation stenosis identified. 3. Chronic right MCA infarct. Salient findings discussed by telephone with RN Deland PrettyMarissa Elizondo on 12/02/2013 at  13:56 .   Electronically Signed   By: Augusto GambleLee  Hall M.D.   On: 12/02/2013 13:58    Scheduled Meds: . allopurinol  100 mg Oral Daily  . aspirin  300 mg Rectal Daily   Or  . aspirin  325 mg Oral Daily  . doxercalciferol  4 mcg Intravenous Q M,W,F-HD  . gabapentin  300 mg Oral QHS  . insulin aspart  0-9 Units Subcutaneous TID WC  . pantoprazole sodium  40 mg Oral QHS   Continuous Infusions:    Principal Problem:   Stroke Active Problems:   Hypertension   Type II or unspecified type diabetes mellitus with unspecified complication, uncontrolled   ESRD on dialysis   CVA (cerebral infarction)   COPD (chronic obstructive pulmonary disease)  Time spent: 35min  Jerald KiefStephen K Chiu  [facility] Pager [phone]. If 7PM-7AM, please contact night-coverage at [email or web address], password [email or web address] 12/03/2013, 11:37 AM  LOS: 2 days

## 2013-12-03 NOTE — Evaluation (Signed)
Occupational Therapy Evaluation Patient Details Name: Stacy Odonnell MRN: [id number] DOB: November 02, 1918 Today's Date: 12/03/2013    History of Present Illness 80 y.o. female admitted with slurred speech.  CT showed acute/subacute are of reduced attenuation involving involving Lt posterior parietal region to old strokes involving Rt parietal and Lt occipital areas.  MRI showed Acute posterior division left MCA infarct, corresponding to the ecent CT finding.   PMH: includes h/o 2 previous CVAs, ESRD with HD MWF DM; THN; emphysema   Clinical Impression   Pt admitted with above. She demonstrates the below listed deficits and will benefit from continued OT to maximize safety and independence with BADLs.  She currently requires mod A for BADLs and min A for functional mobility.  She reports she was modified independent PTA.  Feel she may benefit from CIR prior to returning home with daughter.  Will follow acutely       Follow Up Recommendations  CIR;Supervision/Assistance - 24 hour    Equipment Recommendations  3 in 1 bedside comode    Recommendations for Other Services Rehab consult     Precautions / Restrictions Precautions Precautions: Fall Restrictions Weight Bearing Restrictions: No      Mobility Bed Mobility                  Transfers Overall transfer level: Needs assistance Equipment used: Rolling walker (2 wheeled) Transfers: Sit to/from [employer]GI CorporationStand;Stand Pivot Transfers Sit to Stand: Min assist Stand pivot transfers: Min assist       General transfer comment: Pt with "bouncing"/possible tremors of bil. LEs when ambulating.  Thes decreased with tactile input    Balance Overall balance assessment: Needs assistance Sitting-balance support: Feet supported Sitting balance-Leahy Scale: Good     Standing balance support: Bilateral upper extremity supported Standing balance-Leahy Scale: Poor                              ADL Overall ADL's : Needs  assistance/impaired Eating/Feeding: Modified independent;Sitting   Grooming: Wash/dry hands;Wash/dry face;Oral care;Brushing hair;Minimal assistance;Standing Grooming Details (indicate cue type and reason): min A for balance Upper Body Bathing: Set up;Sitting   Lower Body Bathing: Moderate assistance;Sit to/from stand   Upper Body Dressing : Set up;Supervision/safety;Sitting   Lower Body Dressing: Moderate assistance;Sit to/from stand Lower Body Dressing Details (indicate cue type and reason): Pt uses AE at home Toilet Transfer: Minimal assistance;Comfort height toilet;Ambulation   Toileting- Clothing Manipulation and Hygiene: Moderate assistance;Sit to/from stand       Functional mobility during ADLs: Minimal assistance       Vision Eye Alignment: Within Functional Limits   Ocular Range of Motion: Within Functional Limits Tracking/Visual Pursuits: Other (comment)         Additional Comments: Pt loses object in Lt. inferior quadrant during pursuits. Fields difficult to accurately asses due to severity of HOH and pt unable to understand instructions   Perception     Praxis Praxis Praxis tested?: Within functional limits    Pertinent Vitals/Pain Pt denies pain      Hand Dominance Right   Extremity/Trunk Assessment Upper Extremity Assessment Upper Extremity Assessment: RUE deficits/detail;LUE deficits/detail RUE Deficits / Details: shoulder elevation limited ~80*.  Pt indicates this is her baseline, but uncertain due to severity of HOH RUE Sensation: history of peripheral neuropathy RUE Coordination: decreased gross motor LUE Deficits / Details: shoulder elevation limited ~80*.  Pt indicates this is her baseline, but uncertain due to [facility]  LUE Sensation: history of peripheral neuropathy LUE Coordination: decreased gross motor   Lower Extremity Assessment Lower Extremity Assessment: Defer to PT evaluation       Communication  Communication Communication: Expressive difficulties   Cognition Arousal/Alertness: Awake/alert Behavior During Therapy: WFL for tasks assessed/performed Overall Cognitive Status: Difficult to assess                     General Comments       Exercises       Shoulder Instructions      Home Living Family/patient expects to be discharged to:: Private residence Living Arrangements: Children Available Help at Discharge: Family;Available 24 hours/day Type of Home: House Home Access: Stairs to enter Enter[employer] CorporationEntrance Stairs-Number of Steps: 1   Home Layout: One level               Home Equipment: Walker - 2 wheels;Adaptive equipment Adaptive Equipment: Reacher;Sock aid Additional Comments: Pt severely HOH so difficult to accurately obtain info re: PLOF and home situation.  No family present      Prior Functioning/Environment Level of Independence: Independent with assistive device(s)        Comments: per chart review    OT Diagnosis: Generalized weakness;Disturbance of vision   OT Problem List: Decreased strength;Decreased activity tolerance;Impaired balance (sitting and/or standing);Impaired vision/perception;Decreased coordination;Decreased knowledge of use of DME or AE;Impaired UE functional use   OT Treatment/Interventions: Self-care/ADL training;Neuromuscular education;DME and/or AE instruction;Therapeutic activities;Patient/family education;Balance training;Visual/perceptual remediation/compensation    OT Goals(Current goals can be found in the care plan section) Acute Rehab OT Goals Patient Stated Goal: unable to state due to severity of HOH OT Goal Formulation: With patient Time For Goal Achievement: 12/10/13 Potential to Achieve Goals: Good  OT Frequency: Min 2X/week   Barriers to D/C:    uncertain.  Pt reports she lives with dtr who is home during the day       Co-evaluation              End of Session Equipment Utilized During Treatment:  Engineer, waterolling walker Nurse Communication: Mobility status  Activity Tolerance: Patient tolerated treatment well Patient left: in chair;with call bell/phone within reach;with chair alarm set;with nursing/sitter in room   Time: 1252-1306 OT Time Calculation (min): 14 min Charges:  OT General Charges $OT Visit: 1 Procedure OT Evaluation $Initial OT Evaluation Tier I: 1 Procedure G-Codes:    Ursula AlertWendi M Chalene Treu 12/03/2013, 2:31 PM

## 2013-12-04 DIAGNOSIS — I6330 Cerebral infarction due to thrombosis of unspecified cerebral artery: Secondary | ICD-10-CM

## 2013-12-04 LAB — GLUCOSE, CAPILLARY
GLUCOSE-CAPILLARY: 228 mg/dL — AB (ref 70–99)
Glucose-Capillary: 110 mg/dL — ABNORMAL HIGH (ref 70–99)

## 2013-12-04 MED ORDER — ASPIRIN 325 MG PO TABS: 325.0000 mg | ORAL_TABLET | Freq: Every day | ORAL | Status: AC

## 2013-12-04 NOTE — Progress Notes (Signed)
Noted plans for d/c home per pt preference. [phone]

## 2013-12-04 NOTE — Discharge Summary (Signed)
Physician Discharge Summary  Stacy Odonnell RUE:[id number] DOB: Apr 04, 1919 DOA: 12/01/2013  PCP: No PCP Per Patient  Admit date: 12/01/2013 Discharge date: 12/04/2013  Time spent: 35 minutes  Recommendations for Outpatient Follow-up:  1. Follow up with PCP in 1-2 weeks  Discharge Diagnoses:  Principal Problem:   Stroke Active Problems:   Hypertension   Type II or unspecified type diabetes mellitus with unspecified complication, uncontrolled   ESRD on dialysis   CVA (cerebral infarction)   COPD (chronic obstructive pulmonary disease)   Discharge Condition: Stable  Diet recommendation: Dysphagia 2 with thin liquids  Filed Weights   12/01/13 1804 12/03/13 1356 12/03/13 1805  Weight: 83.961 kg (185 lb 1.6 oz) 86 kg (189 lb 9.5 oz) 83.9 kg (184 lb 15.5 oz)    History of present illness:  Stacy Odonnell is a 80 y.o. female  has a past medical history of Stroke; Diabetes mellitus without complication; Hypertension; Cancer; Dialysis patient; Emphysema lung; and Renal failure.  Presented with  Patient was noted today to be slurring her speech around 11:30. Her family brought her to [facility] at 3 pm her speech has improved somewhat but not back to normal yet. She is on HD 3 times a week on M, W, and F. Last dialysis was on Friday 4/17 and went without trouble. Patient states they took off 6 lb. She have had some abdominal pain and constipation denies any fever chills or chest pain. She did not have any trouble walking. Denies any perceived weakness. She has hx of a.fib but not on anticoagulation. Family states she was on some form of anticoagulation but that caused excessive bruising. She Had hx of TIA about 2 years ago.   Hospital Course:  1. Acute/subacute L MCA Infarct  1. Neurology following 2. MRI brain with acute L MCA infarct 3. 2D echo pending 4. Carotid dopplers unremarkable 5. PT/OT/SLP recs for CIR initially. Family declines placement and requested d/c home with home  therapy 6. LDL 38 7. Per Neurology, continue ASA for stroke prophylaxis instead of coumadin, especially as pt has a hx of difficult to control bleeding 2. HTN  1. BP stable and controlled 2. Cont current meds 3. DM  1. FSBS stable and controlled 2. Cont SSI coverage for now 3. Diabetic diet 4. COPD  1. Stable 2. On min O2 support 5. ESRD  1. Nephrology consulted for scheduled MWF HD 6. DVT prophylaxis  1. SCD's while inpatient  Consultations:  Neurology  Nephrology  Discharge Exam: Filed Vitals:   12/03/13 2147 12/04/13 0244 12/04/13 0544 12/04/13 0944  BP: 115/62 124/63 129/61 112/49  Pulse: 80 53 69 70  Temp: 98.6 F (37 C) 98.8 F (37.1 C) 98.4 F (36.9 C) 97.9 F (36.6 C)  TempSrc: Oral Oral Oral Oral  Resp: 18 16 16 16   Height:      Weight:      SpO2: 95% 95% 93% 94%    General: Awake, in nad Cardiovascular: regular, s1, s2 Respiratory: normal resp effort, no wheezing  Discharge Instructions     Medication List    TAKE these medications       acetaminophen 650 MG CR tablet  Commonly known as:  TYLENOL  Take 650 mg by mouth every 8 (eight) hours as needed for pain.     albuterol (2.5 MG/3ML) 0.083% nebulizer solution  Commonly known as:  PROVENTIL  Take 2.5 mg by nebulization every 6 (six) hours as needed for wheezing or shortness of breath.  albuterol 108 (90 BASE) MCG/ACT inhaler  Commonly known as:  PROVENTIL HFA;VENTOLIN HFA  Inhale 1 puff into the lungs every 6 (six) hours as needed for wheezing or shortness of breath.     allopurinol 100 MG tablet  Commonly known as:  ZYLOPRIM  Take 100 mg by mouth daily.     calcium carbonate 600 MG Tabs tablet  Commonly known as:  OS-CAL  Take 600 mg by mouth 2 (two) times daily with a meal. With D     clotrimazole 1 % cream  Commonly known as:  LOTRIMIN  Apply 1 application topically daily as needed (rash).     docusate sodium 100 MG capsule  Commonly known as:  COLACE  Take 100 mg by  mouth 2 (two) times daily as needed for mild constipation.     FISH OIL PO  Take 325 mg by mouth 2 (two) times daily.     furosemide 40 MG tablet  Commonly known as:  LASIX  Take 40 mg by mouth 2 (two) times daily.     gabapentin 300 MG capsule  Commonly known as:  NEURONTIN  Take 300 mg by mouth at bedtime.     insulin glargine 100 UNIT/ML injection  Commonly known as:  LANTUS  Inject 10-30 Units into the skin at bedtime. 10-30 units     KIDNEY PO  Take 1 tablet by mouth daily. "Z" tablet  Kidney vitamin     lactobacillus acidophilus Tabs tablet  Take 1 tablet by mouth 3 (three) times daily.     omeprazole 20 MG capsule  Commonly known as:  PRILOSEC  Take 20 mg by mouth daily.     sevelamer carbonate 800 MG tablet  Commonly known as:  RENVELA  Take 800 mg by mouth 3 (three) times daily with meals. Once with snack     simvastatin 40 MG tablet  Commonly known as:  ZOCOR  Take 40 mg by mouth at bedtime.     tiotropium 18 MCG inhalation capsule  Commonly known as:  SPIRIVA  Place 18 mcg into inhaler and inhale daily.      ASK your doctor about these medications       aspirin 81 MG tablet  Take 81 mg by mouth daily.  Ask about: Which instructions should I use?     aspirin 325 MG tablet  Take 1 tablet (325 mg total) by mouth daily.  Ask about: Which instructions should I use?       Allergies  Allergen Reactions  . Codeine Nausea And Vomiting  . Levaquin [Levofloxacin In D5w] Other (See Comments)    Out of it  . Vicodin [Hydrocodone-Acetaminophen]     Confused, hypoxia  . Vistaril [Hydroxyzine Hcl] Other (See Comments)    unknown   Follow-up Information   Follow up with Gates Rigg, MD. Schedule an appointment as soon as possible for a visit in 2 months. (Stroke Clinic)    Specialties:  Neurology, Radiology   Contact information:   [address] [place] [address] [phone]       Schedule an appointment as soon as possible for  a visit with Follow up with your PCP in 1-2 weeks.      Follow up with follow up with dialysis as scheduled.       The results of significant diagnostics from this hospitalization (including imaging, microbiology, ancillary and laboratory) are listed below for reference.    Significant Diagnostic Studies: Ct Head Wo Contrast  12/01/2013   CLINICAL DATA:  80 year old female code stroke. Slurred speech. Initial encounter.  EXAM: CT HEAD WITHOUT CONTRAST  TECHNIQUE: Contiguous axial images were obtained from the base of the skull through the vertex without intravenous contrast.  COMPARISON:  08/22/2013 and earlier.  FINDINGS: Stable visualized osseous structures. Stable paranasal sinuses and mastoids. No acute orbit or scalp soft tissue findings.  Calcified atherosclerosis at the skull base. Cerebral volume is within normal limits for age. Chronic posterior right MCA infarct re- identified with encephalomalacia. Chronic small left PCA infarct affecting the left occipital pole re- identified and stable.  New cortically based hypodensity in the posterior left MCA territory. Series 2, image 23). Parietal lobe primarily affected. No associated mass effect or hemorrhage is evident.  No suspicious intracranial vascular hyperdensity. Stable gray-white matter differentiation elsewhere. No ventriculomegaly. No midline shift, mass effect, or evidence of intracranial mass lesion.  IMPRESSION: 1. Acute/subacute posterior left MCA infarct. No associated mass effect or hemorrhage at this time. 2. Stable chronic right MCA and left PCA infarcts. Study discussed by telephone with Dr. Linwood DibblesJON KNAPP on 12/01/2013 at 14:34 .   Electronically Signed   By: Augusto GambleLee  Hall M.D.   On: 12/01/2013 14:34   Mr Brain Wo Contrast  12/02/2013   CLINICAL DATA:  80 year old female with acute onset slurred speech. Prior stroke. Posterior left MCA infarct detected on recent CT. Initial encounter.  EXAM: MRI HEAD WITHOUT CONTRAST  MRA HEAD WITHOUT  CONTRAST  TECHNIQUE: Multiplanar, multiecho pulse sequences of the brain and surrounding structures were obtained without intravenous contrast. Angiographic images of the head were obtained using MRA technique without contrast.  COMPARISON:  Head CT without contrast 12/01/2013. [facility]/25/2011.  FINDINGS: MRI HEAD FINDINGS  Cortical and subcortical restricted diffusion in the posterior left MCA territory primarily affecting the parietal lobe, corresponding to the CT finding. There is some involvement of the posterior superior left temporal lobe. Associated gyral edema. Petechial hemorrhages present (series 9, image 125). No significant mass effect.  No contralateral right hemisphere or posterior fossa restricted diffusion. Major intracranial vascular flow voids are preserved.  Chronic hemorrhagic posterior right MCA territory infarct. Superimposed more mild for age cerebral white matter T2 and FLAIR hyperintensity. Deep gray matter nuclei, brainstem and cerebellum within normal limits for age. Visible internal auditory structures appear normal. Mastoids are clear. No midline shift or ventriculomegaly. Negative pituitary, cervicomedullary junction and visualized cervical spine. Normal bone marrow signal. Chronic left sphenoid sinusitis. Stable paranasal sinuses. Postoperative changes to the globes. Visualized scalp soft tissues are within normal limits.  MRA HEAD FINDINGS  Antegrade flow in the posterior circulation with codominant distal vertebral arteries. Patent vertebrobasilar junction. Patent left PICA origin. Patent AICA origins. No basilar stenosis. SCA and PCA origins are normal. Posterior communicating arteries are diminutive or absent. Bilateral PCA branches are within normal limits.  Antegrade flow in both ICA siphons. Atherosclerosis resulting in mild bilateral ICA stenosis, worse on the right. Ophthalmic artery origins within normal limits.  Carotid termini are patent.  MCA and ACA origins are patent. Anterior communicating artery and visualized ACA branches are within normal limits. Visualized right MCA branches are within normal limits.  Left MCA M1 segment is patent. Visualized left MCA branches are within normal limits.  IMPRESSION: 1. Acute posterior division left MCA infarct, corresponding to the recent CT finding. Positive for petechial hemorrhage but no mass effect or malignant hemorrhagic transformation at this time. 2. Negative for age intracranial MRA. No left  MCA branch occlusion or hemodynamically significant anterior circulation stenosis identified. 3. Chronic right MCA infarct. Salient findings discussed by telephone with RN Deland PrettyMarissa Elizondo on 12/02/2013 at 13:56 .   Electronically Signed   By: Augusto GambleLee  Hall M.D.   On: 12/02/2013 13:58   Mr Maxine GlennMra Head/brain Wo Cm  12/02/2013   CLINICAL DATA:  80 year old female with acute onset slurred speech. Prior stroke. Posterior left MCA infarct detected on recent CT. Initial encounter.  EXAM: MRI HEAD WITHOUT CONTRAST  MRA HEAD WITHOUT CONTRAST  TECHNIQUE: Multiplanar, multiecho pulse sequences of the brain and surrounding structures were obtained without intravenous contrast. Angiographic images of the head were obtained using MRA technique without contrast.  COMPARISON:  Head CT without contrast 12/01/2013. [facility]/25/2011.  FINDINGS: MRI HEAD FINDINGS  Cortical and subcortical restricted diffusion in the posterior left MCA territory primarily affecting the parietal lobe, corresponding to the CT finding. There is some involvement of the posterior superior left temporal lobe. Associated gyral edema. Petechial hemorrhages present (series 9, image 125). No significant mass effect.  No contralateral right hemisphere or posterior fossa restricted diffusion. Major intracranial vascular flow voids are preserved.  Chronic hemorrhagic posterior right MCA territory infarct. Superimposed more mild for age  cerebral white matter T2 and FLAIR hyperintensity. Deep gray matter nuclei, brainstem and cerebellum within normal limits for age. Visible internal auditory structures appear normal. Mastoids are clear. No midline shift or ventriculomegaly. Negative pituitary, cervicomedullary junction and visualized cervical spine. Normal bone marrow signal. Chronic left sphenoid sinusitis. Stable paranasal sinuses. Postoperative changes to the globes. Visualized scalp soft tissues are within normal limits.  MRA HEAD FINDINGS  Antegrade flow in the posterior circulation with codominant distal vertebral arteries. Patent vertebrobasilar junction. Patent left PICA origin. Patent AICA origins. No basilar stenosis. SCA and PCA origins are normal. Posterior communicating arteries are diminutive or absent. Bilateral PCA branches are within normal limits.  Antegrade flow in both ICA siphons. Atherosclerosis resulting in mild bilateral ICA stenosis, worse on the right. Ophthalmic artery origins within normal limits.  Carotid termini are patent. MCA and ACA origins are patent. Anterior communicating artery and visualized ACA branches are within normal limits. Visualized right MCA branches are within normal limits.  Left MCA M1 segment is patent. Visualized left MCA branches are within normal limits.  IMPRESSION: 1. Acute posterior division left MCA infarct, corresponding to the recent CT finding. Positive for petechial hemorrhage but no mass effect or malignant hemorrhagic transformation at this time. 2. Negative for age intracranial MRA. No left MCA branch occlusion or hemodynamically significant anterior circulation stenosis identified. 3. Chronic right MCA infarct. Salient findings discussed by telephone with RN Deland PrettyMarissa Elizondo on 12/02/2013 at 13:56 .   Electronically Signed   By: Augusto GambleLee  Hall M.D.   On: 12/02/2013 13:58    Microbiology: No results found for this or any previous visit (from the past 240 hour(s)).   Labs: Basic  Metabolic Panel:  Recent Labs Lab 12/01/13 1505  NA 140  K 4.4  CL 97  CO2 26  GLUCOSE 164*  BUN 27*  CREATININE 4.30*  CALCIUM 9.9   Liver Function Tests:  Recent Labs Lab 12/01/13 1505  AST 41*  ALT 30  ALKPHOS 369*  BILITOT 0.5  PROT 8.2  ALBUMIN 3.6   No results found for this basename: LIPASE, AMYLASE,  in the last 168 hours No results found for this basename: AMMONIA,  in the last 168 hours CBC:  Recent Labs Lab  12/01/13 1505  WBC 5.4  NEUTROABS 2.6  HGB 11.9*  HCT 35.8*  MCV 73.5*  PLT 167   Cardiac Enzymes:  Recent Labs Lab 12/01/13 1505  TROPONINI <0.30   BNP: BNP (last 3 results) No results found for this basename: PROBNP,  in the last 8760 hours CBG:  Recent Labs Lab 12/03/13 0631 12/03/13 1251 12/03/13 1829 12/03/13 2150 12/04/13 0733  GLUCAP 88 135* 106* 228* 110*   Signed:  Jerald Kief  [facility] 12/04/2013, 1:05 PM

## 2013-12-04 NOTE — Progress Notes (Signed)
Speech Language Pathology Treatment: Dysphagia;Cognitive-Linquistic  Patient Details Name: Stacy Odonnell MRN: [id number] DOB: 1919-08-15 Today's Date: 12/04/2013 Time: [id number] SLP Time Calculation (min): 44 min  Assessment / Plan / Recommendation Clinical Impression  Pt seen with daughter present and observed consuming soft cooked peaches and water.  Dis-coordination of swallow noted with throat clear immediately after swallow of water via cup.  Pt admits to having to be careful with intake currently due to worsening swallow ability with cva.  She was apprehensive regarding meats that are chopped/dry and therefore advised pt/dtr to ground meats as needed and use extra gravy/sauce.   Recommend advance to mechanical soft diet/ground meats/thin with continued precautions.    Xerostomia compensation strategies for chronicity of dry mouth per pt and sample packets of thickener provided to use if pt notes worsening "choking" with liquids that does not improve.  Demonstrated chin tuck posture for trial as indicated.  Pt and daughter stated understanding of information provided.  Suspect baseline dysphagia due to ill fitting dentures that is now exacerbated with current CVAs.    In addition, SLP reiterated effective compensation strategies to facilitate pt's expressive language with demonstration to pt/daughter.     Pt progressing re: use of compensation strategies and po tolerance.  Thanks for letting me help with this pleasant pt's care.    Home Health SLP indicated to maximize pt's swallow and language function-pt and daughter agreeable.     HPI HPI: Stacy MaffucciMarie Harts is an 80 y.o. female with a history of. diabetes mellitus, stroke,  hypertension and lipidemia who developed acute onset of slurred speech.  She arrived in the emergency room for evaluation at 3 PM. She was beyond time window for consideration for treatment with TPA. CT scan of her head showed acute/subacute area of reduced attenuation  involving the left posterior parietal region, in addition to old strokes involving right parietal and left occipital areas. Patient being seen for dysphagia, cognitive linguistic treatment.     Pertinent Vitals Afebrile, decreased  SLP Plan  Continue with current plan of care    Recommendations Diet recommendations: Dysphagia 3 (mechanical soft);Thin liquid Liquids provided via: Cup Medication Administration: Whole meds with puree (crushed if large and not contraindicated) Supervision: Patient able to self feed;Staff to assist with self feeding;Full supervision/cueing for compensatory strategies Compensations: Slow rate;Small sips/bites;Check for pocketing Postural Changes and/or Swallow Maneuvers: Seated upright 90 degrees;Upright 30-60 min after meal              Oral Care Recommendations: Oral care BID Follow up Recommendations: Home health SLP Plan: Continue with current plan of care    GO     Mills Kolleramara Ann Keyontae Huckeby Altha Sweitzer, MS [facility] SLP [phone]

## 2013-12-04 NOTE — Progress Notes (Signed)
Patient given discharge instructions, teaching, and medication list. Patient assessed and stable.  Patient DCd and left via wheelchair.

## 2013-12-04 NOTE — Progress Notes (Signed)
Physical Therapy Treatment Patient Details Name: Stacy MaffucciMarie Odonnell MRN: [id number] DOB: [birth year] Today's Date: 12/04/2013    History of Present Illness 80 y.o. female admitted with slurred speech.  CT showed acute/subacute are of reduced attenuation involving involving Lt posterior parietal region to old strokes involving Rt parietal and Lt occipital areas.  MRI showed Acute posterior division left MCA infarct, corresponding to the ecent CT finding.   PMH: includes h/o 2 previous CVAs, ESRD with HD MWF DM; THN; emphysema    PT Comments    Has made up her mind with help of daughter to go home and have therapy, ideally on non dialysis days.  At this point, she will be safe with her daughter's assist.   Follow Up Recommendations  Home health PT;Supervision/Assistance - 24 hour     Equipment Recommendations  None recommended by PT    Recommendations for Other Services       Precautions / Restrictions Precautions Precautions: Fall Restrictions Weight Bearing Restrictions: No    Mobility  Bed Mobility Overal bed mobility: Needs Assistance Bed Mobility: Supine to Sit;Sit to Supine     Supine to sit: Mod assist Sit to supine: Min assist   General bed mobility comments: cues for best technique/sequencing/  truncal assist to get up to elbow and LE asssit  Transfers Overall transfer level: Needs assistance Equipment used: Rolling walker (2 wheeled) Transfers: Sit to/from [employer]Stand;Stand Pivot Transfers Sit to Stand: Min guard;Min assist (depending on surface height) Stand pivot transfers: Min guard;Min assist       General transfer comment: depending on fatigue or anxiety  Ambulation/Gait Ambulation/Gait assistance: Min assist;Min guard Ambulation Distance (Feet): 140 Feet Assistive device: Rolling walker (2 wheeled) Gait Pattern/deviations: Step-through pattern Gait velocity: decreased Gait velocity interpretation: Below normal speed for age/gender General Gait Details: L LE  trembling/bouncing when pt becomes anxious o/w slow equal steps   Stairs            Wheelchair Mobility    Modified Rankin (Stroke Patients Only) Modified Rankin (Stroke Patients Only) Pre-Morbid Rankin Score: No symptoms Modified Rankin: Moderately severe disability     Balance Overall balance assessment: No apparent balance deficits (not formally assessed) Sitting-balance support: Feet supported;No upper extremity supported Sitting balance-Leahy Scale: Good       Standing balance-Leahy Scale: Poor                      Cognition Arousal/Alertness: Awake/alert Behavior During Therapy: WFL for tasks assessed/performed                 Problem Solving: Slow processing      Exercises      General Comments        Pertinent Vitals/Pain     Home Living                      Prior Function            PT Goals (current goals can now be found in the care plan section) Acute Rehab PT Goals PT Goal Formulation: With patient Time For Goal Achievement: 12/09/13 Potential to Achieve Goals: Good Progress towards PT goals: Progressing toward goals    Frequency  Min 3X/week    PT Plan Discharge plan needs to be updated    Co-evaluation             End of Session   Activity Tolerance: Patient tolerated treatment well Patient left: in chair;with call bell/phone within  reach;with chair alarm set;with family/visitor present     Time: 1010-1102 PT Time Calculation (min): 52 min  Charges:  $Gait Training: 8-22 mins $Therapeutic Activity: 8-22 mins $Self Care/Home Management: 8-22                    G CodesEliseo Odonnell:      Stacy Odonnell V Stacy Odonnell 12/04/2013, 11:12 AM 12/04/2013  Cementon BingKen [name], PT [phone]  (pager)

## 2013-12-05 LAB — GLUCOSE, CAPILLARY: GLUCOSE-CAPILLARY: 132 mg/dL — AB (ref 70–99)

## 2014-02-04 ENCOUNTER — Encounter (HOSPITAL_BASED_OUTPATIENT_CLINIC_OR_DEPARTMENT_OTHER): Payer: Self-pay | Admitting: Emergency Medicine

## 2014-02-04 ENCOUNTER — Emergency Department (HOSPITAL_BASED_OUTPATIENT_CLINIC_OR_DEPARTMENT_OTHER)
Admission: EM | Admit: 2014-02-04 | Discharge: 2014-02-04 | Disposition: A | Discharge status: Other Acute Inpatient Hospital | Payer: MEDICARE | Attending: Emergency Medicine | Admitting: Emergency Medicine

## 2014-02-04 ENCOUNTER — Emergency Department (HOSPITAL_BASED_OUTPATIENT_CLINIC_OR_DEPARTMENT_OTHER): Payer: MEDICARE

## 2014-02-04 DIAGNOSIS — E119 Type 2 diabetes mellitus without complications: Secondary | ICD-10-CM | POA: Insufficient documentation

## 2014-02-04 DIAGNOSIS — Z8673 Personal history of transient ischemic attack (TIA), and cerebral infarction without residual deficits: Secondary | ICD-10-CM | POA: Insufficient documentation

## 2014-02-04 DIAGNOSIS — Z992 Dependence on renal dialysis: Secondary | ICD-10-CM | POA: Insufficient documentation

## 2014-02-04 DIAGNOSIS — Z87891 Personal history of nicotine dependence: Secondary | ICD-10-CM | POA: Insufficient documentation

## 2014-02-04 DIAGNOSIS — Z8709 Personal history of other diseases of the respiratory system: Secondary | ICD-10-CM | POA: Insufficient documentation

## 2014-02-04 DIAGNOSIS — K8500 Idiopathic acute pancreatitis without necrosis or infection: Secondary | ICD-10-CM

## 2014-02-04 DIAGNOSIS — Z79899 Other long term (current) drug therapy: Secondary | ICD-10-CM | POA: Insufficient documentation

## 2014-02-04 DIAGNOSIS — N186 End stage renal disease: Secondary | ICD-10-CM | POA: Insufficient documentation

## 2014-02-04 DIAGNOSIS — Z859 Personal history of malignant neoplasm, unspecified: Secondary | ICD-10-CM | POA: Insufficient documentation

## 2014-02-04 DIAGNOSIS — Z7982 Long term (current) use of aspirin: Secondary | ICD-10-CM | POA: Insufficient documentation

## 2014-02-04 DIAGNOSIS — I120 Hypertensive chronic kidney disease with stage 5 chronic kidney disease or end stage renal disease: Secondary | ICD-10-CM | POA: Insufficient documentation

## 2014-02-04 DIAGNOSIS — K859 Acute pancreatitis without necrosis or infection, unspecified: Secondary | ICD-10-CM | POA: Insufficient documentation

## 2014-02-04 LAB — COMPREHENSIVE METABOLIC PANEL
ALK PHOS: 376 U/L — AB (ref 39–117)
ALT: 16 U/L (ref 0–35)
AST: 26 U/L (ref 0–37)
Albumin: 3.6 g/dL (ref 3.5–5.2)
BILIRUBIN TOTAL: 0.7 mg/dL (ref 0.3–1.2)
BUN: 35 mg/dL — AB (ref 6–23)
CHLORIDE: 92 meq/L — AB (ref 96–112)
CO2: 24 mEq/L (ref 19–32)
Calcium: 10.1 mg/dL (ref 8.4–10.5)
Creatinine, Ser: 5.5 mg/dL — ABNORMAL HIGH (ref 0.50–1.10)
GFR, EST AFRICAN AMERICAN: 7 mL/min — AB (ref >90)
GFR, EST NON AFRICAN AMERICAN: 6 mL/min — AB (ref >90)
GLUCOSE: 80 mg/dL (ref 70–99)
POTASSIUM: 5.4 meq/L — AB (ref 3.7–5.3)
Sodium: 133 mEq/L — ABNORMAL LOW (ref 137–147)
Total Protein: 8.5 g/dL — ABNORMAL HIGH (ref 6.0–8.3)

## 2014-02-04 LAB — CBC WITH DIFFERENTIAL/PLATELET
Basophils Absolute: 0 10*3/uL (ref 0.0–0.1)
Basophils Relative: 0 % (ref 0–1)
Eosinophils Absolute: 0.2 10*3/uL (ref 0.0–0.7)
Eosinophils Relative: 3 % (ref 0–5)
HCT: 32.7 % — ABNORMAL LOW (ref 36.0–46.0)
HEMOGLOBIN: 11.2 g/dL — AB (ref 12.0–15.0)
Lymphocytes Relative: 34 % (ref 12–46)
Lymphs Abs: 1.9 10*3/uL (ref 0.7–4.0)
MCH: 24.1 pg — AB (ref 26.0–34.0)
MCHC: 34.3 g/dL (ref 30.0–36.0)
MCV: 70.5 fL — ABNORMAL LOW (ref 78.0–100.0)
MONOS PCT: 16 % — AB (ref 3–12)
Monocytes Absolute: 0.9 10*3/uL (ref 0.1–1.0)
NEUTROS ABS: 2.7 10*3/uL (ref 1.7–7.7)
NEUTROS PCT: 47 % (ref 43–77)
Platelets: 235 10*3/uL (ref 150–400)
RBC: 4.64 MIL/uL (ref 3.87–5.11)
RDW: 15.4 % (ref 11.5–15.5)
WBC: 5.7 10*3/uL (ref 4.0–10.5)

## 2014-02-04 LAB — CBG MONITORING, ED: Glucose-Capillary: 78 mg/dL (ref 70–99)

## 2014-02-04 LAB — LIPASE, BLOOD: Lipase: 448 U/L — ABNORMAL HIGH (ref 11–59)

## 2014-02-04 MED ORDER — FENTANYL CITRATE 0.05 MG/ML IJ SOLN
50.0000 ug | Freq: Once | INTRAMUSCULAR | Status: AC
  Administered 2014-02-04: 50 ug via INTRAVENOUS
  Filled 2014-02-04: qty 2

## 2014-02-04 NOTE — ED Provider Notes (Signed)
CSN: [id number]     Arrival date & time 02/04/14  0710 History   First MD Initiated Contact with Patient 02/04/14 [phone]     Chief Complaint  Patient presents with  . Constipation      HPI PT reports 9/10 upper abdominal pain with constipation. "Very small" bowel movements despite using Miralax and olive oil. Pt with poor appetite also. NAD but appears uncomfortable. Here with daughter from home.  Denies fever, chills, nausea, vomiting.  Past Medical History  Diagnosis Date  . Stroke   . Diabetes mellitus without complication   . Hypertension   . Cancer   . Dialysis patient   . Emphysema lung   . Renal failure    Past Surgical History  Procedure Laterality Date  . Abdominal hysterectomy    . Cholecystectomy    . Tonsillectomy    . Joint replacement    . Shoulder surgery     History reviewed. No pertinent family history. History  Substance Use Topics  . Smoking status: Former Games developermoker  . Smokeless tobacco: Not on file  . Alcohol Use: Not on file   OB History   Grav Para Term Preterm Abortions TAB SAB Ect Mult Living                 Review of Systems  All other systems reviewed and are negative  Allergies  Codeine; Levaquin; Vicodin; and Vistaril  Home Medications   Prior to Admission medications   Medication Sig Start Date End Date Taking? Authorizing Provider  acetaminophen (TYLENOL) 650 MG CR tablet Take 650 mg by mouth every 8 (eight) hours as needed for pain.    Historical Provider, MD  albuterol (PROVENTIL HFA;VENTOLIN HFA) 108 (90 BASE) MCG/ACT inhaler Inhale 1 puff into the lungs every 6 (six) hours as needed for wheezing or shortness of breath.    Historical Provider, MD  albuterol (PROVENTIL) (2.5 MG/3ML) 0.083% nebulizer solution Take 2.5 mg by nebulization every 6 (six) hours as needed for wheezing or shortness of breath.    Historical Provider, MD  allopurinol (ZYLOPRIM) 100 MG tablet Take 100 mg by mouth daily.    Historical Provider, MD  aspirin 325  MG tablet Take 1 tablet (325 mg total) by mouth daily. 12/04/13   Jerald KiefStephen K Chiu, MD  calcium carbonate (OS-CAL) 600 MG TABS tablet Take 600 mg by mouth 2 (two) times daily with a meal. With D    Historical Provider, MD  clotrimazole (LOTRIMIN) 1 % cream Apply 1 application topically daily as needed (rash).     Historical Provider, MD  docusate sodium (COLACE) 100 MG capsule Take 100 mg by mouth 2 (two) times daily as needed for mild constipation.     Historical Provider, MD  furosemide (LASIX) 40 MG tablet Take 40 mg by mouth 2 (two) times daily.    Historical Provider, MD  gabapentin (NEURONTIN) 300 MG capsule Take 300 mg by mouth at bedtime.    Historical Provider, MD  insulin glargine (LANTUS) 100 UNIT/ML injection Inject 10-30 Units into the skin at bedtime. 10-30 units    Historical Provider, MD  lactobacillus acidophilus (BACID) TABS tablet Take 1 tablet by mouth 3 (three) times daily.    Historical Provider, MD  Omega-3 Fatty Acids (FISH OIL PO) Take 325 mg by mouth 2 (two) times daily.    Historical Provider, MD  omeprazole (PRILOSEC) 20 MG capsule Take 20 mg by mouth daily.     Historical Provider, MD  sevelamer carbonate (RENVELA)  800 MG tablet Take 800 mg by mouth 3 (three) times daily with meals. Once with snack    Historical Provider, MD  simvastatin (ZOCOR) 40 MG tablet Take 40 mg by mouth at bedtime.     Historical Provider, MD  Specialty Vitamins Products (KIDNEY PO) Take 1 tablet by mouth daily. "Z" tablet  Kidney vitamin    Historical Provider, MD  tiotropium (SPIRIVA) 18 MCG inhalation capsule Place 18 mcg into inhaler and inhale daily.    Historical Provider, MD   BP 139/62  Pulse 77  Temp(Src) 98.3 F (36.8 C) (Oral)  Resp 18  SpO2 96% Physical Exam  Nursing note and vitals reviewed. Constitutional: She is oriented to person, place, and time. She appears well-developed and well-nourished. No distress.  HENT:  Head: Normocephalic and atraumatic.  Eyes: Pupils are equal,  round, and reactive to light.  Neck: Normal range of motion.  Cardiovascular: Normal rate and intact distal pulses.   Pulmonary/Chest: No respiratory distress.  Abdominal: Normal appearance. She exhibits no distension. There is tenderness in the right upper quadrant. There is no rigidity, no rebound, no guarding and no CVA tenderness.  Musculoskeletal: Normal range of motion.  Neurological: She is alert and oriented to person, place, and time. No cranial nerve deficit.  Skin: Skin is warm and dry. No rash noted.  Psychiatric: She has a normal mood and affect. Her behavior is normal.    ED Course  Procedures (including critical care time) Labs Review Labs Reviewed  CBC WITH DIFFERENTIAL - Abnormal; Notable for the following:    Hemoglobin 11.2 (*)    HCT 32.7 (*)    MCV 70.5 (*)    MCH 24.1 (*)    Monocytes Relative 16 (*)    All other components within normal limits  COMPREHENSIVE METABOLIC PANEL - Abnormal; Notable for the following:    Sodium 133 (*)    Potassium 5.4 (*)    Chloride 92 (*)    BUN 35 (*)    Creatinine, Ser 5.50 (*)    Total Protein 8.5 (*)    Alkaline Phosphatase 376 (*)    GFR calc non Af Amer 6 (*)    GFR calc Af Amer 7 (*)    All other components within normal limits  LIPASE, BLOOD - Abnormal; Notable for the following:    Lipase 448 (*)    All other components within normal limits  CBG MONITORING, ED    Her current nephrologist who is also her primary care provider is:  [facility] Adeyinka A Adegoroye [address] [place], [place] [address] [phone]  Imaging Review No results found.  I discussed the case with Dr.Adegoroye' who asked me to transfer her for direct admission [facility].  She'll be transferred to Dr. Louis MeckelStovall who is his partner.  He will contact Dr. Louis MeckelStovall and let her know about the transfer.  MDM   Final diagnoses:  Idiopathic acute pancreatitis  ESRD on dialysis         Nelia Shiobert L Beaton, MD 02/10/14 [phone]

## 2014-02-04 NOTE — ED Notes (Signed)
PT reports 9/10 upper abdominal pain with constipation. "Very small" bowel movements despite using Miralax and olive oil. Pt with poor appetite also. NAD but appears uncomfortable. Here with daughter from home.

## 2014-02-18 ENCOUNTER — Emergency Department (HOSPITAL_BASED_OUTPATIENT_CLINIC_OR_DEPARTMENT_OTHER): Payer: MEDICARE

## 2014-02-18 ENCOUNTER — Encounter (HOSPITAL_BASED_OUTPATIENT_CLINIC_OR_DEPARTMENT_OTHER): Payer: Self-pay | Admitting: Emergency Medicine

## 2014-02-18 ENCOUNTER — Emergency Department (HOSPITAL_BASED_OUTPATIENT_CLINIC_OR_DEPARTMENT_OTHER)
Admission: EM | Admit: 2014-02-18 | Discharge: 2014-02-18 | Disposition: A | Discharge status: Other Acute Inpatient Hospital | Payer: MEDICARE | Attending: Emergency Medicine | Admitting: Emergency Medicine

## 2014-02-18 DIAGNOSIS — J438 Other emphysema: Secondary | ICD-10-CM | POA: Insufficient documentation

## 2014-02-18 DIAGNOSIS — Z7982 Long term (current) use of aspirin: Secondary | ICD-10-CM | POA: Insufficient documentation

## 2014-02-18 DIAGNOSIS — G244 Idiopathic orofacial dystonia: Secondary | ICD-10-CM | POA: Insufficient documentation

## 2014-02-18 DIAGNOSIS — R259 Unspecified abnormal involuntary movements: Secondary | ICD-10-CM | POA: Insufficient documentation

## 2014-02-18 DIAGNOSIS — I120 Hypertensive chronic kidney disease with stage 5 chronic kidney disease or end stage renal disease: Secondary | ICD-10-CM | POA: Insufficient documentation

## 2014-02-18 DIAGNOSIS — Z87891 Personal history of nicotine dependence: Secondary | ICD-10-CM | POA: Insufficient documentation

## 2014-02-18 DIAGNOSIS — E119 Type 2 diabetes mellitus without complications: Secondary | ICD-10-CM | POA: Insufficient documentation

## 2014-02-18 DIAGNOSIS — Z992 Dependence on renal dialysis: Secondary | ICD-10-CM | POA: Insufficient documentation

## 2014-02-18 DIAGNOSIS — N186 End stage renal disease: Secondary | ICD-10-CM | POA: Insufficient documentation

## 2014-02-18 DIAGNOSIS — I4891 Unspecified atrial fibrillation: Secondary | ICD-10-CM | POA: Insufficient documentation

## 2014-02-18 DIAGNOSIS — Z794 Long term (current) use of insulin: Secondary | ICD-10-CM | POA: Insufficient documentation

## 2014-02-18 DIAGNOSIS — R29898 Other symptoms and signs involving the musculoskeletal system: Secondary | ICD-10-CM

## 2014-02-18 DIAGNOSIS — Z8551 Personal history of malignant neoplasm of bladder: Secondary | ICD-10-CM | POA: Insufficient documentation

## 2014-02-18 DIAGNOSIS — M6281 Muscle weakness (generalized): Secondary | ICD-10-CM | POA: Insufficient documentation

## 2014-02-18 DIAGNOSIS — Z79899 Other long term (current) drug therapy: Secondary | ICD-10-CM | POA: Insufficient documentation

## 2014-02-18 DIAGNOSIS — Z8673 Personal history of transient ischemic attack (TIA), and cerebral infarction without residual deficits: Secondary | ICD-10-CM | POA: Insufficient documentation

## 2014-02-18 DIAGNOSIS — R51 Headache: Secondary | ICD-10-CM | POA: Insufficient documentation

## 2014-02-18 HISTORY — DX: Unspecified atrial fibrillation: I48.91

## 2014-02-18 LAB — CBG MONITORING, ED: Glucose-Capillary: 122 mg/dL — ABNORMAL HIGH (ref 70–99)

## 2014-02-18 LAB — TROPONIN I: Troponin I: <0.3 ng/mL (ref <0.30)

## 2014-02-18 LAB — CBC
HEMATOCRIT: 34 % — AB (ref 36.0–46.0)
Hemoglobin: 11.5 g/dL — ABNORMAL LOW (ref 12.0–15.0)
MCH: 24.2 pg — ABNORMAL LOW (ref 26.0–34.0)
MCHC: 33.8 g/dL (ref 30.0–36.0)
MCV: 71.6 fL — ABNORMAL LOW (ref 78.0–100.0)
Platelets: 158 10*3/uL (ref 150–400)
RBC: 4.75 MIL/uL (ref 3.87–5.11)
RDW: 16.6 % — AB (ref 11.5–15.5)
WBC: 5.1 10*3/uL (ref 4.0–10.5)

## 2014-02-18 LAB — COMPREHENSIVE METABOLIC PANEL
ALT: 9 U/L (ref 0–35)
AST: 22 U/L (ref 0–37)
Albumin: 3.4 g/dL — ABNORMAL LOW (ref 3.5–5.2)
Alkaline Phosphatase: 261 U/L — ABNORMAL HIGH (ref 39–117)
Anion gap: 12 (ref 5–15)
BILIRUBIN TOTAL: 0.6 mg/dL (ref 0.3–1.2)
BUN: 9 mg/dL (ref 6–23)
CHLORIDE: 102 meq/L (ref 96–112)
CO2: 28 meq/L (ref 19–32)
CREATININE: 2.5 mg/dL — AB (ref 0.50–1.10)
Calcium: 9.6 mg/dL (ref 8.4–10.5)
GFR calc Af Amer: 18 mL/min — ABNORMAL LOW (ref >90)
GFR calc non Af Amer: 16 mL/min — ABNORMAL LOW (ref >90)
Glucose, Bld: 154 mg/dL — ABNORMAL HIGH (ref 70–99)
POTASSIUM: 3.9 meq/L (ref 3.7–5.3)
SODIUM: 142 meq/L (ref 137–147)
Total Protein: 8 g/dL (ref 6.0–8.3)

## 2014-02-18 LAB — DIFFERENTIAL
BASOS ABS: 0 10*3/uL (ref 0.0–0.1)
Basophils Relative: 1 % (ref 0–1)
Eosinophils Absolute: 0.2 10*3/uL (ref 0.0–0.7)
Eosinophils Relative: 4 % (ref 0–5)
LYMPHS PCT: 40 % (ref 12–46)
Lymphs Abs: 2 10*3/uL (ref 0.7–4.0)
MONO ABS: 0.8 10*3/uL (ref 0.1–1.0)
Monocytes Relative: 16 % — ABNORMAL HIGH (ref 3–12)
NEUTROS ABS: 2 10*3/uL (ref 1.7–7.7)
NEUTROS PCT: 40 % — AB (ref 43–77)

## 2014-02-18 LAB — PROTIME-INR
INR: 1.25 (ref 0.00–1.49)
Prothrombin Time: 15.7 seconds — ABNORMAL HIGH (ref 11.6–15.2)

## 2014-02-18 LAB — ETHANOL

## 2014-02-18 LAB — APTT: APTT: 43 s — AB (ref 24–37)

## 2014-02-18 NOTE — ED Notes (Signed)
Pt is a dialysis pt and was just dialyzed today. She only urinates once a day. Probable will not get a urine specimen for analysis

## 2014-02-18 NOTE — ED Provider Notes (Signed)
Plains of left arm weakness and difficulty speaking onset 9:30 PM yesterday. He had trouble using her walker with her left hand earlier today. She presently reports difficulty in using her left arm. And not speaking clearly. On exam patient is alert Glasgow Coma Score 15. Proventil drift is negative pain is 2 through 12 grossly intact. Moves all extremities well. NIH stroke scale 0-1  Doug SouSam Viraj Liby, MD 02/18/14 1714

## 2014-02-18 NOTE — ED Provider Notes (Signed)
CSN: [id number]     Arrival date & time 02/18/14  1551 History   First MD Initiated Contact with Patient 02/18/14 1618     Chief Complaint  Patient presents with  . Extremity Weakness     (Consider location/radiation/quality/duration/timing/severity/associated sxs/prior Treatment) HPI Stacy Odonnell is a 80 y.o. female that presents to the ED for left extremity shaking. Patient has a history of CVA in April 2015, atrial fibrillation, currently not on anticoagulation, ESRD on dialysis and diabetes. Symptoms started last night around 9:30PM. Patient was playing cards and could not coordinate movements with left hand shaking. Symptoms lasted about 5 minutes. This morning, patient had trouble pulling bed covers and holding walker due to left hand grip weakness. She went to dialysis today and was found to have significant left arm shaking and was advised to be evaluated in an ED. Patient has some associated dysarthria yesterday which also lasted a few minutes. Her symptoms have improved currently but she does not feel back to baseline. No vision changes, no facial droop, no gait instability. Patient on aspirin 325mg  daily, last taken today. Patient not started on anticoagulation therapy because of bleeding history while receiving dialysis.  Past Medical History  Diagnosis Date  . Stroke   . Diabetes mellitus without complication   . Hypertension   . Dialysis patient   . Emphysema lung   . Renal failure   . Cancer     bladder cancer  . Atrial fibrillation    Past Surgical History  Procedure Laterality Date  . Abdominal hysterectomy    . Cholecystectomy    . Tonsillectomy    . Joint replacement    . Shoulder surgery     No family history on file. History  Substance Use Topics  . Smoking status: Former Games developer  . Smokeless tobacco: Not on file  . Alcohol Use: No   OB History   Grav Para Term Preterm Abortions TAB SAB Ect Mult Living                 Review of Systems   Constitutional: Negative for fever.  Cardiovascular: Negative for chest pain.  Neurological: Positive for weakness and headaches (once last night). Negative for syncope and facial asymmetry.  All other systems reviewed and are negative.     Allergies  Codeine; Levaquin; Vicodin; and Vistaril  Home Medications   Prior to Admission medications   Medication Sig Start Date End Date Taking? Authorizing Provider  acetaminophen (TYLENOL) 650 MG CR tablet Take 650 mg by mouth every 8 (eight) hours as needed for pain.   Yes Historical Provider, MD  albuterol (PROVENTIL HFA;VENTOLIN HFA) 108 (90 BASE) MCG/ACT inhaler Inhale 1 puff into the lungs every 6 (six) hours as needed for wheezing or shortness of breath.   Yes Historical Provider, MD  albuterol (PROVENTIL) (2.5 MG/3ML) 0.083% nebulizer solution Take 2.5 mg by nebulization every 6 (six) hours as needed for wheezing or shortness of breath.   Yes Historical Provider, MD  allopurinol (ZYLOPRIM) 100 MG tablet Take 100 mg by mouth daily.   Yes Historical Provider, MD  aspirin 325 MG tablet Take 1 tablet (325 mg total) by mouth daily. 12/04/13  Yes Jerald Kief, MD  calcium carbonate (OS-CAL) 600 MG TABS tablet Take 600 mg by mouth 2 (two) times daily with a meal. With D   Yes Historical Provider, MD  clotrimazole (LOTRIMIN) 1 % cream Apply 1 application topically daily as needed (rash).    Yes Historical Provider,  MD  docusate sodium (COLACE) 100 MG capsule Take 100 mg by mouth 2 (two) times daily as needed for mild constipation.    Yes Historical Provider, MD  furosemide (LASIX) 40 MG tablet Take 40 mg by mouth 2 (two) times daily.   Yes Historical Provider, MD  gabapentin (NEURONTIN) 300 MG capsule Take 300 mg by mouth at bedtime.   Yes Historical Provider, MD  insulin glargine (LANTUS) 100 UNIT/ML injection Inject 10-30 Units into the skin at bedtime. 10-30 units   Yes Historical Provider, MD  Omega-3 Fatty Acids (FISH OIL PO) Take 325 mg by  mouth 2 (two) times daily.   Yes Historical Provider, MD  omeprazole (PRILOSEC) 20 MG capsule Take 20 mg by mouth daily.    Yes Historical Provider, MD  sevelamer carbonate (RENVELA) 800 MG tablet Take 1,600 mg by mouth 3 (three) times daily with meals. Once with snack   Yes Historical Provider, MD  simvastatin (ZOCOR) 40 MG tablet Take 40 mg by mouth at bedtime.    Yes Historical Provider, MD  Specialty Vitamins Products (KIDNEY PO) Take 1 tablet by mouth daily. "Z" tablet  Kidney vitamin   Yes Historical Provider, MD  tiotropium (SPIRIVA) 18 MCG inhalation capsule Place 18 mcg into inhaler and inhale daily.   Yes Historical Provider, MD  lactobacillus acidophilus (BACID) TABS tablet Take 1 tablet by mouth 3 (three) times daily.    Historical Provider, MD   BP 135/63  Pulse 60  Temp(Src) 98.7 F (37.1 C) (Oral)  Resp 20  Wt 184 lb (83.462 kg)  SpO2 96% Physical Exam  Constitutional: She is oriented to person, place, and time. She appears well-developed and well-nourished.  Eyes: Conjunctivae and EOM are normal. Pupils are equal, round, and reactive to light.  Neck: Normal range of motion. Neck supple.  Cardiovascular: Normal rate, normal heart sounds, intact distal pulses and normal pulses.  An irregularly irregular rhythm present.  Pulmonary/Chest: Effort normal and breath sounds normal. She has no decreased breath sounds. She has no wheezes.  Abdominal: Soft. Normal appearance and bowel sounds are normal. There is no rigidity, no rebound and no guarding.  Musculoskeletal:       Left upper arm: She exhibits deformity (Fistula with bruit and thrill).  Neurological: She is alert and oriented to person, place, and time. She has normal strength. She displays tremor (Severe shaking with finger to nose. Patient with left sided diadodyskinesia.). No cranial nerve deficit or sensory deficit. She displays a negative Romberg sign. GCS eye subscore is 4. GCS verbal subscore is 5. GCS motor subscore is  6.  Reflex Scores:      Bicep reflexes are 2+ on the right side and 1+ on the left side. Heel to shin not completed    ED Course  Procedures (including critical care time) Labs Review Labs Reviewed  PROTIME-INR - Abnormal; Notable for the following:    Prothrombin Time 15.7 (*)    All other components within normal limits  APTT - Abnormal; Notable for the following:    aPTT 43 (*)    All other components within normal limits  CBC - Abnormal; Notable for the following:    Hemoglobin 11.5 (*)    HCT 34.0 (*)    MCV 71.6 (*)    MCH 24.2 (*)    RDW 16.6 (*)    All other components within normal limits  DIFFERENTIAL - Abnormal; Notable for the following:    Neutrophils Relative % 40 (*)  Monocytes Relative 16 (*)    All other components within normal limits  COMPREHENSIVE METABOLIC PANEL - Abnormal; Notable for the following:    Glucose, Bld 154 (*)    Creatinine, Ser 2.50 (*)    Albumin 3.4 (*)    Alkaline Phosphatase 261 (*)    GFR calc non Af Amer 16 (*)    GFR calc Af Amer 18 (*)    All other components within normal limits  CBG MONITORING, ED - Abnormal; Notable for the following:    Glucose-Capillary 122 (*)    All other components within normal limits  ETHANOL  TROPONIN I    Imaging Review Ct Head Wo Contrast  02/18/2014   CLINICAL DATA:  Left-sided weakness.  EXAM: CT HEAD WITHOUT CONTRAST  TECHNIQUE: Contiguous axial images were obtained from the base of the skull through the vertex without intravenous contrast.  COMPARISON:  CT scan of October 03, 2013; MRI scan of December 02, 2013.  FINDINGS: Bony calvarium appears intact. Mild diffuse cortical atrophy is noted. Stable encephalomalacia is noted in right parietal cortex as well as the left posterior parietal cortex consistent with old infarction. No mass effect or midline shift is noted. Ventricular size is within normal limits. There is no evidence of mass lesion, hemorrhage or acute infarction.  IMPRESSION: Old  bilateral parietal infarctions. Mild diffuse cortical atrophy. No acute intracranial abnormality seen.   Electronically Signed   By: Roque LiasJames  Green M.D.   On: 02/18/2014 17:54     EKG Interpretation   Date/Time:  Monday February 18 2014 17:32:26 EDT Ventricular Rate:  68 PR Interval:    QRS Duration: 138 QT Interval:  498 QTC Calculation: 529 R Axis:   -88 Text Interpretation:  Undetermined rhythm Left axis deviation Right bundle  branch block Septal infarct , age undetermined Possible Lateral infarct ,  age undetermined Abnormal ECG No significant change since last tracing  Confirmed by JACUBOWITZ  MD, SAM [id number]) on 02/18/2014 5:33:46 PM      MDM   Final diagnoses:  Left arm weakness   Patient vitals stable on arrival. Symptoms consistent with probable cerebellar infarct. CT with no acute findings. EKG shows rate controlled atrial fibrillation. Labs significant for stable anemia with hemoglobin of 11.5 and INR of 1.25. Creatinine 2.50. Initial troponin negative. Patient requiring inpatient admission for stroke workup. Patient [facility] be [facility] Dr. Anson FretFernando Ariza from [facility] is accepting physician. Carelink updated. EMTALA completed. Patient reexamined before transfer and found to be stable and unchanged.     Jacquelin Hawkingalph Demeka Sutter, MD 02/19/14 [phone]

## 2014-02-18 NOTE — ED Notes (Signed)
She is having difficulty swallowing during the stroke swallow screen. She was unable to swallow a small piece of cracker without coughing and almost choking. States it won't go down.

## 2014-02-18 NOTE — ED Notes (Signed)
Patient and daughter of patient states last night at approximately 2100, patient was playing cards and developed a weakness in left hand.  Family states the weakness lasted approximately 2 minutes and resolved.  States today while receiving hemodialysis, the family mentioned this to her physician and was told to come to the emergency department for a head scan.

## 2014-02-18 NOTE — ED Notes (Signed)
pts family has to leave but may go to [facility] later tonight

## 2014-02-18 NOTE — ED Notes (Signed)
Report was called to [facility]

## 2014-02-19 NOTE — ED Provider Notes (Signed)
I have personally seen and examined the patient.  I have discussed the plan of care with the resident.  I have reviewed the documentation on PMH/FH/Soc. History.  I have reviewed the documentation of the resident and agree.  Lamorris Knoblock, MD 02/19/14 1454 

## 2014-06-29 ENCOUNTER — Encounter (HOSPITAL_BASED_OUTPATIENT_CLINIC_OR_DEPARTMENT_OTHER): Payer: Self-pay | Admitting: Emergency Medicine

## 2014-06-29 ENCOUNTER — Emergency Department (HOSPITAL_BASED_OUTPATIENT_CLINIC_OR_DEPARTMENT_OTHER)
Admission: EM | Admit: 2014-06-29 | Discharge: 2014-06-29 | Disposition: A | Discharge status: Other Acute Inpatient Hospital | Payer: MEDICARE | Attending: Emergency Medicine | Admitting: Emergency Medicine

## 2014-06-29 ENCOUNTER — Emergency Department (HOSPITAL_BASED_OUTPATIENT_CLINIC_OR_DEPARTMENT_OTHER): Payer: MEDICARE

## 2014-06-29 DIAGNOSIS — N186 End stage renal disease: Secondary | ICD-10-CM | POA: Diagnosis not present

## 2014-06-29 DIAGNOSIS — Z794 Long term (current) use of insulin: Secondary | ICD-10-CM | POA: Insufficient documentation

## 2014-06-29 DIAGNOSIS — R05 Cough: Secondary | ICD-10-CM | POA: Diagnosis present

## 2014-06-29 DIAGNOSIS — I120 Hypertensive chronic kidney disease with stage 5 chronic kidney disease or end stage renal disease: Secondary | ICD-10-CM | POA: Insufficient documentation

## 2014-06-29 DIAGNOSIS — Z7982 Long term (current) use of aspirin: Secondary | ICD-10-CM | POA: Diagnosis not present

## 2014-06-29 DIAGNOSIS — J441 Chronic obstructive pulmonary disease with (acute) exacerbation: Secondary | ICD-10-CM | POA: Insufficient documentation

## 2014-06-29 DIAGNOSIS — J159 Unspecified bacterial pneumonia: Secondary | ICD-10-CM | POA: Diagnosis not present

## 2014-06-29 DIAGNOSIS — Z8673 Personal history of transient ischemic attack (TIA), and cerebral infarction without residual deficits: Secondary | ICD-10-CM | POA: Insufficient documentation

## 2014-06-29 DIAGNOSIS — Z992 Dependence on renal dialysis: Secondary | ICD-10-CM | POA: Diagnosis not present

## 2014-06-29 DIAGNOSIS — Z79899 Other long term (current) drug therapy: Secondary | ICD-10-CM | POA: Insufficient documentation

## 2014-06-29 DIAGNOSIS — I4891 Unspecified atrial fibrillation: Secondary | ICD-10-CM | POA: Insufficient documentation

## 2014-06-29 DIAGNOSIS — Z87891 Personal history of nicotine dependence: Secondary | ICD-10-CM | POA: Diagnosis not present

## 2014-06-29 DIAGNOSIS — E119 Type 2 diabetes mellitus without complications: Secondary | ICD-10-CM | POA: Insufficient documentation

## 2014-06-29 DIAGNOSIS — J189 Pneumonia, unspecified organism: Secondary | ICD-10-CM

## 2014-06-29 DIAGNOSIS — R059 Cough, unspecified: Secondary | ICD-10-CM

## 2014-06-29 DIAGNOSIS — Z8551 Personal history of malignant neoplasm of bladder: Secondary | ICD-10-CM | POA: Diagnosis not present

## 2014-06-29 LAB — CBC WITH DIFFERENTIAL/PLATELET
BASOS PCT: 0 % (ref 0–1)
Basophils Absolute: 0 10*3/uL (ref 0.0–0.1)
EOS ABS: 0.2 10*3/uL (ref 0.0–0.7)
EOS PCT: 2 % (ref 0–5)
HEMATOCRIT: 34 % — AB (ref 36.0–46.0)
HEMOGLOBIN: 11.3 g/dL — AB (ref 12.0–15.0)
Lymphocytes Relative: 23 % (ref 12–46)
Lymphs Abs: 2 10*3/uL (ref 0.7–4.0)
MCH: 23.5 pg — AB (ref 26.0–34.0)
MCHC: 33.2 g/dL (ref 30.0–36.0)
MCV: 70.7 fL — ABNORMAL LOW (ref 78.0–100.0)
MONO ABS: 1.2 10*3/uL — AB (ref 0.1–1.0)
Monocytes Relative: 14 % — ABNORMAL HIGH (ref 3–12)
Neutro Abs: 5.5 10*3/uL (ref 1.7–7.7)
Neutrophils Relative %: 61 % (ref 43–77)
Platelets: 155 10*3/uL (ref 150–400)
RBC: 4.81 MIL/uL (ref 3.87–5.11)
RDW: 17.2 % — ABNORMAL HIGH (ref 11.5–15.5)
WBC: 8.9 10*3/uL (ref 4.0–10.5)

## 2014-06-29 LAB — BASIC METABOLIC PANEL
Anion gap: 17 — ABNORMAL HIGH (ref 5–15)
BUN: 30 mg/dL — ABNORMAL HIGH (ref 6–23)
CALCIUM: 9.8 mg/dL (ref 8.4–10.5)
CO2: 26 meq/L (ref 19–32)
Chloride: 95 mEq/L — ABNORMAL LOW (ref 96–112)
Creatinine, Ser: 4 mg/dL — ABNORMAL HIGH (ref 0.50–1.10)
GFR calc Af Amer: 10 mL/min — ABNORMAL LOW (ref >90)
GFR calc non Af Amer: 9 mL/min — ABNORMAL LOW (ref >90)
GLUCOSE: 150 mg/dL — AB (ref 70–99)
Potassium: 3.9 mEq/L (ref 3.7–5.3)
Sodium: 138 mEq/L (ref 137–147)

## 2014-06-29 LAB — TROPONIN I: Troponin I: <0.3 ng/mL (ref <0.30)

## 2014-06-29 MED ORDER — IPRATROPIUM-ALBUTEROL 0.5-2.5 (3) MG/3ML IN SOLN
3.0000 mL | RESPIRATORY_TRACT | Status: DC
  Administered 2014-06-29: 3 mL via RESPIRATORY_TRACT
  Filled 2014-06-29: qty 3

## 2014-06-29 MED ORDER — DEXTROSE 5 % IV SOLN
1.0000 g | Freq: Once | INTRAVENOUS | Status: AC
  Administered 2014-06-29: 1 g via INTRAVENOUS

## 2014-06-29 MED ORDER — DEXTROSE 5 % IV SOLN
500.0000 mg | Freq: Once | INTRAVENOUS | Status: AC
  Administered 2014-06-29: 500 mg via INTRAVENOUS
  Filled 2014-06-29: qty 500

## 2014-06-29 MED ORDER — CEFTRIAXONE SODIUM 1 G IJ SOLR
INTRAMUSCULAR | Status: AC
  Filled 2014-06-29: qty 10

## 2014-06-29 MED ORDER — ONDANSETRON HCL 4 MG/2ML IJ SOLN
4.0000 mg | Freq: Once | INTRAMUSCULAR | Status: AC
  Administered 2014-06-29: 4 mg via INTRAVENOUS
  Filled 2014-06-29: qty 2

## 2014-06-29 MED ORDER — METHYLPREDNISOLONE SODIUM SUCC 125 MG IJ SOLR
125.0000 mg | Freq: Once | INTRAMUSCULAR | Status: AC
  Administered 2014-06-29: 125 mg via INTRAVENOUS
  Filled 2014-06-29: qty 2

## 2014-06-29 NOTE — ED Notes (Signed)
PT presents to ED with complaints of cough and possible pneumonia. Pt was at dialysis Tuesday and her doc there gave her doxycycline but pt does not feel any better and daughter states cough may be getting worse and she is afraid she is getting pneumonia.

## 2014-06-29 NOTE — ED Provider Notes (Signed)
TIME SEEN: 1:22 PM  CHIEF COMPLAINT: cough, shortness of breath, wheezing  HPI: Pt is a 80 y.o. F with history of hypertension, diabetes, end-stage renal disease on hemodialysis Monday, Wednesday and Friday who was last dialyzed yesterday, CVA, atrial fibrillation not on anticoagulation, COPD not home oxygen who presents to the emergency department with several days of productive cough with yellow sputum, chills, chest pain worse with coughing, shortness of breath or wheezing. She was diagnosed with possible pneumonia at dialysis earlier this week and started on doxycycline. States that she feels her symptoms are getting worse and set of better. No vomiting or diarrhea. No headache, neck pain or neck stiffness. She was exposed to a family member who recently had similar symptoms. No travel.  ROS: See HPI Constitutional: no fever  Eyes: no drainage  ENT: no runny nose   Cardiovascular:   chest pain  Resp: SOB  GI: no vomiting GU: no dysuria Integumentary: no rash  Allergy: no hives  Musculoskeletal: no leg swelling  Neurological: no slurred speech ROS otherwise negative  PAST MEDICAL HISTORY/PAST SURGICAL HISTORY:  Past Medical History  Diagnosis Date  . Stroke   . Diabetes mellitus without complication   . Hypertension   . Dialysis patient   . Emphysema lung   . Renal failure   . Cancer     bladder cancer  . Atrial fibrillation     MEDICATIONS:  Prior to Admission medications   Medication Sig Start Date End Date Taking? Authorizing Provider  allopurinol (ZYLOPRIM) 100 MG tablet Take 100 mg by mouth daily.   Yes Historical Provider, MD  calcium carbonate (OS-CAL) 600 MG TABS tablet Take 600 mg by mouth 2 (two) times daily with a meal. With D   Yes Historical Provider, MD  clotrimazole (LOTRIMIN) 1 % cream Apply 1 application topically daily as needed (rash).    Yes Historical Provider, MD  docusate sodium (COLACE) 100 MG capsule Take 100 mg by mouth 2 (two) times daily as  needed for mild constipation.    Yes Historical Provider, MD  furosemide (LASIX) 40 MG tablet Take 40 mg by mouth 2 (two) times daily.   Yes Historical Provider, MD  gabapentin (NEURONTIN) 300 MG capsule Take 300 mg by mouth at bedtime.   Yes Historical Provider, MD  insulin glargine (LANTUS) 100 UNIT/ML injection Inject 10-30 Units into the skin at bedtime. 10-30 units   Yes Historical Provider, MD  lactobacillus acidophilus (BACID) TABS tablet Take 1 tablet by mouth 3 (three) times daily.   Yes Historical Provider, MD  Omega-3 Fatty Acids (FISH OIL PO) Take 325 mg by mouth 2 (two) times daily.   Yes Historical Provider, MD  omeprazole (PRILOSEC) 20 MG capsule Take 20 mg by mouth daily.    Yes Historical Provider, MD  sevelamer carbonate (RENVELA) 800 MG tablet Take 1,600 mg by mouth 3 (three) times daily with meals. Once with snack   Yes Historical Provider, MD  simvastatin (ZOCOR) 40 MG tablet Take 40 mg by mouth at bedtime.    Yes Historical Provider, MD  Specialty Vitamins Products (KIDNEY PO) Take 1 tablet by mouth daily. "Z" tablet  Kidney vitamin   Yes Historical Provider, MD  tiotropium (SPIRIVA) 18 MCG inhalation capsule Place 18 mcg into inhaler and inhale daily.   Yes Historical Provider, MD  acetaminophen (TYLENOL) 650 MG CR tablet Take 650 mg by mouth every 8 (eight) hours as needed for pain.    Historical Provider, MD  albuterol (PROVENTIL HFA;VENTOLIN  HFA) 108 (90 BASE) MCG/ACT inhaler Inhale 1 puff into the lungs every 6 (six) hours as needed for wheezing or shortness of breath.    Historical Provider, MD  albuterol (PROVENTIL) (2.5 MG/3ML) 0.083% nebulizer solution Take 2.5 mg by nebulization every 6 (six) hours as needed for wheezing or shortness of breath.    Historical Provider, MD  aspirin 325 MG tablet Take 1 tablet (325 mg total) by mouth daily. 12/04/13   Jerald KiefStephen K Chiu, MD    ALLERGIES:  Allergies  Allergen Reactions  . Codeine Nausea And Vomiting  . Levaquin  [Levofloxacin In D5w] Other (See Comments)    Out of it  . Vicodin [Hydrocodone-Acetaminophen]     Confused, hypoxia  . Vistaril [Hydroxyzine Hcl] Other (See Comments)    unknown    SOCIAL HISTORY:  History  Substance Use Topics  . Smoking status: Former Games developermoker  . Smokeless tobacco: Not on file  . Alcohol Use: No    FAMILY HISTORY: No family history on file.  EXAM: BP 123/39 mmHg  Pulse 64  Temp(Src) 98.6 F (37 C) (Oral)  Resp 18  Ht 5\' 3"  (1.6 m)  Wt 179 lb (81.194 kg)  BMI 31.72 kg/m2  SpO2 96% CONSTITUTIONAL: Alert and oriented and responds appropriately to questions. Well-appearing; well-nourished HEAD: Normocephalic EYES: Conjunctivae clear, PERRL ENT: normal nose; no rhinorrhea; moist mucous membranes; pharynx without lesions noted NECK: Supple, no meningismus, no LAD  CARD: RRR; S1 and S2 appreciated; no murmurs, no clicks, no rubs, no gallops RESP: Normal chest excursion without splinting or tachypnea; breath sounds equal bilaterally but there is mild diffuse wheezing diffusely, prolonged expiratory phase, patient is speaking short sentences, no hypoxia ABD/GI: Normal bowel sounds; non-distended; soft, non-tender, no rebound, no guarding BACK:  The back appears normal and is non-tender to palpation, there is no CVA tenderness EXT: Normal ROM in all joints; non-tender to palpation; no edema; normal capillary refill; no cyanosis    SKIN: Normal color for age and race; warm NEURO: Moves all extremities equally; sensation to light touch intact diffusely PSYCH: The patient's mood and manner are appropriate. Grooming and personal hygiene are appropriate.  MEDICAL DECISION MAKING: Pt here with concerns for possible pneumonia.  She does appear to have a COPD exacerbation. We'll give duo neb, Solu-Medrol. No hypoxia but she does have some tachypnea and is speaking short sentences with prolonged expiratory phase.  Will obtain chest x-ray. Given she is a dialysis patient,  will obtain labs. EKG shows no new ischemic changes.  ED PROGRESS: Pt's labs are unremarkable. No leukocytosis but chest x-ray is suggestive of a right infrahilar and right basilar early infiltrate. Given she has been on antibiotics and his worsening, will admit to medicine for community-acquired pneumonia for IV antibiotics. We'll obtain blood cultures, give ceftriaxone and azithromycin. Patient is allergic to Levaquin.  Breath sounds are improving after DuoNeb treatment. She is requesting to be admitted to [facility].  Her PCP is Adegorye (her nephrologist).   4:00 PM  D/w Dr. Renee RivalNsiah for admission to [facility], telemetry, inpatient.   EKG Interpretation  Date/Time:  Saturday June 29 2014 13:28:50 EST Ventricular Rate:  67 PR Interval:    QRS Duration: 146 QT Interval:  488 QTC Calculation: 515 R Axis:   -82 Text Interpretation:  Atrial fibrillation Left axis deviation Right bundle branch block Abnormal ECG No significant change since last tracing Confirmed by WARD,  DO, KRISTEN ([id number]) on 06/29/2014 1:42:56 PM  Layla Maw Ward, DO 06/29/14 [phone]

## 2014-07-05 LAB — CULTURE, BLOOD (ROUTINE X 2)
Culture: NO GROWTH
Culture: NO GROWTH

## 2014-12-24 IMAGING — CR DG ABDOMEN 1V
2 series · 2 of 2 positions shown · non-contrast
Comparison: May 12, 2012.

CLINICAL DATA: Abdominal pain.

EXAM:
ABDOMEN - 1 VIEW

[t abdomen supine (1 of 2)]
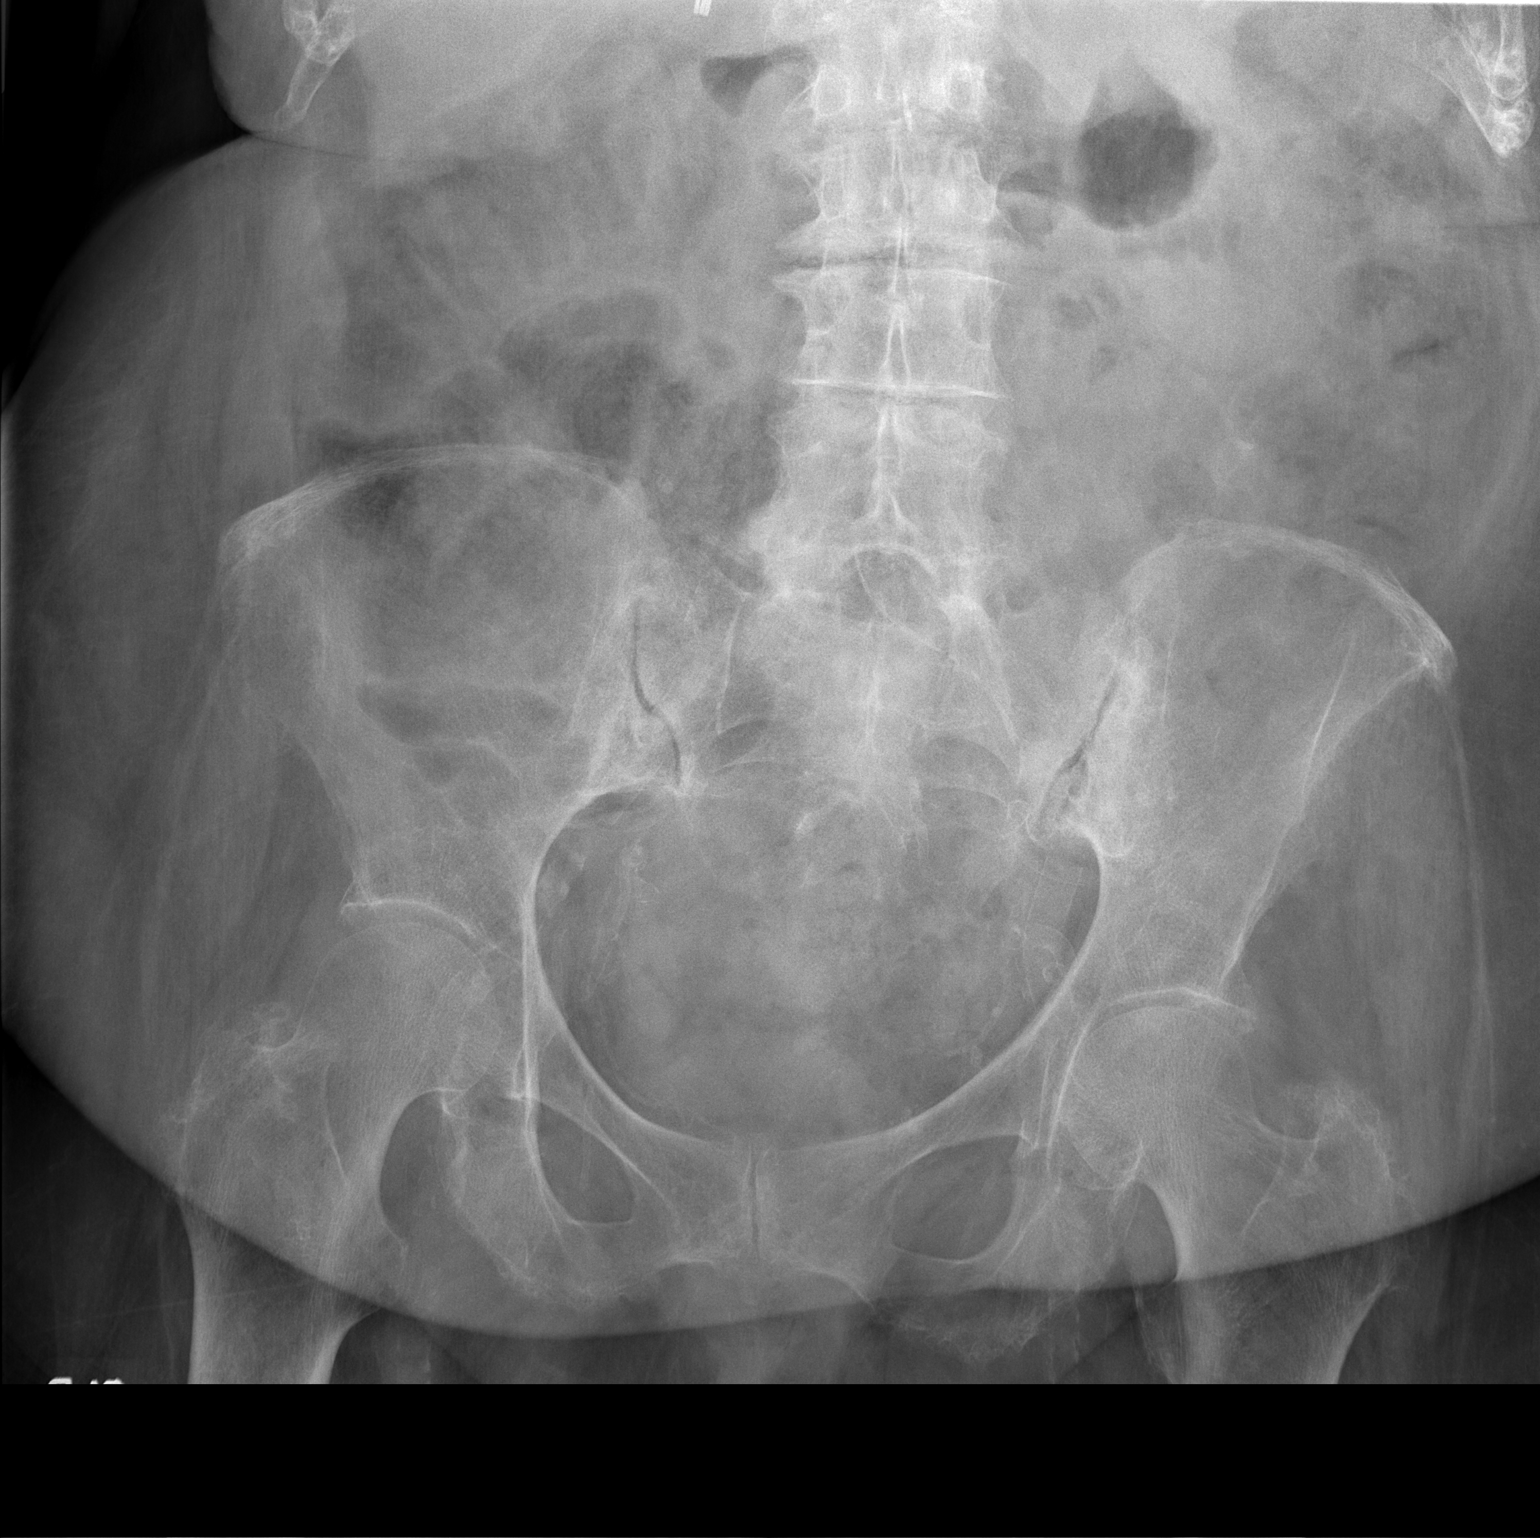

[t abdomen supine (2 of 2)]
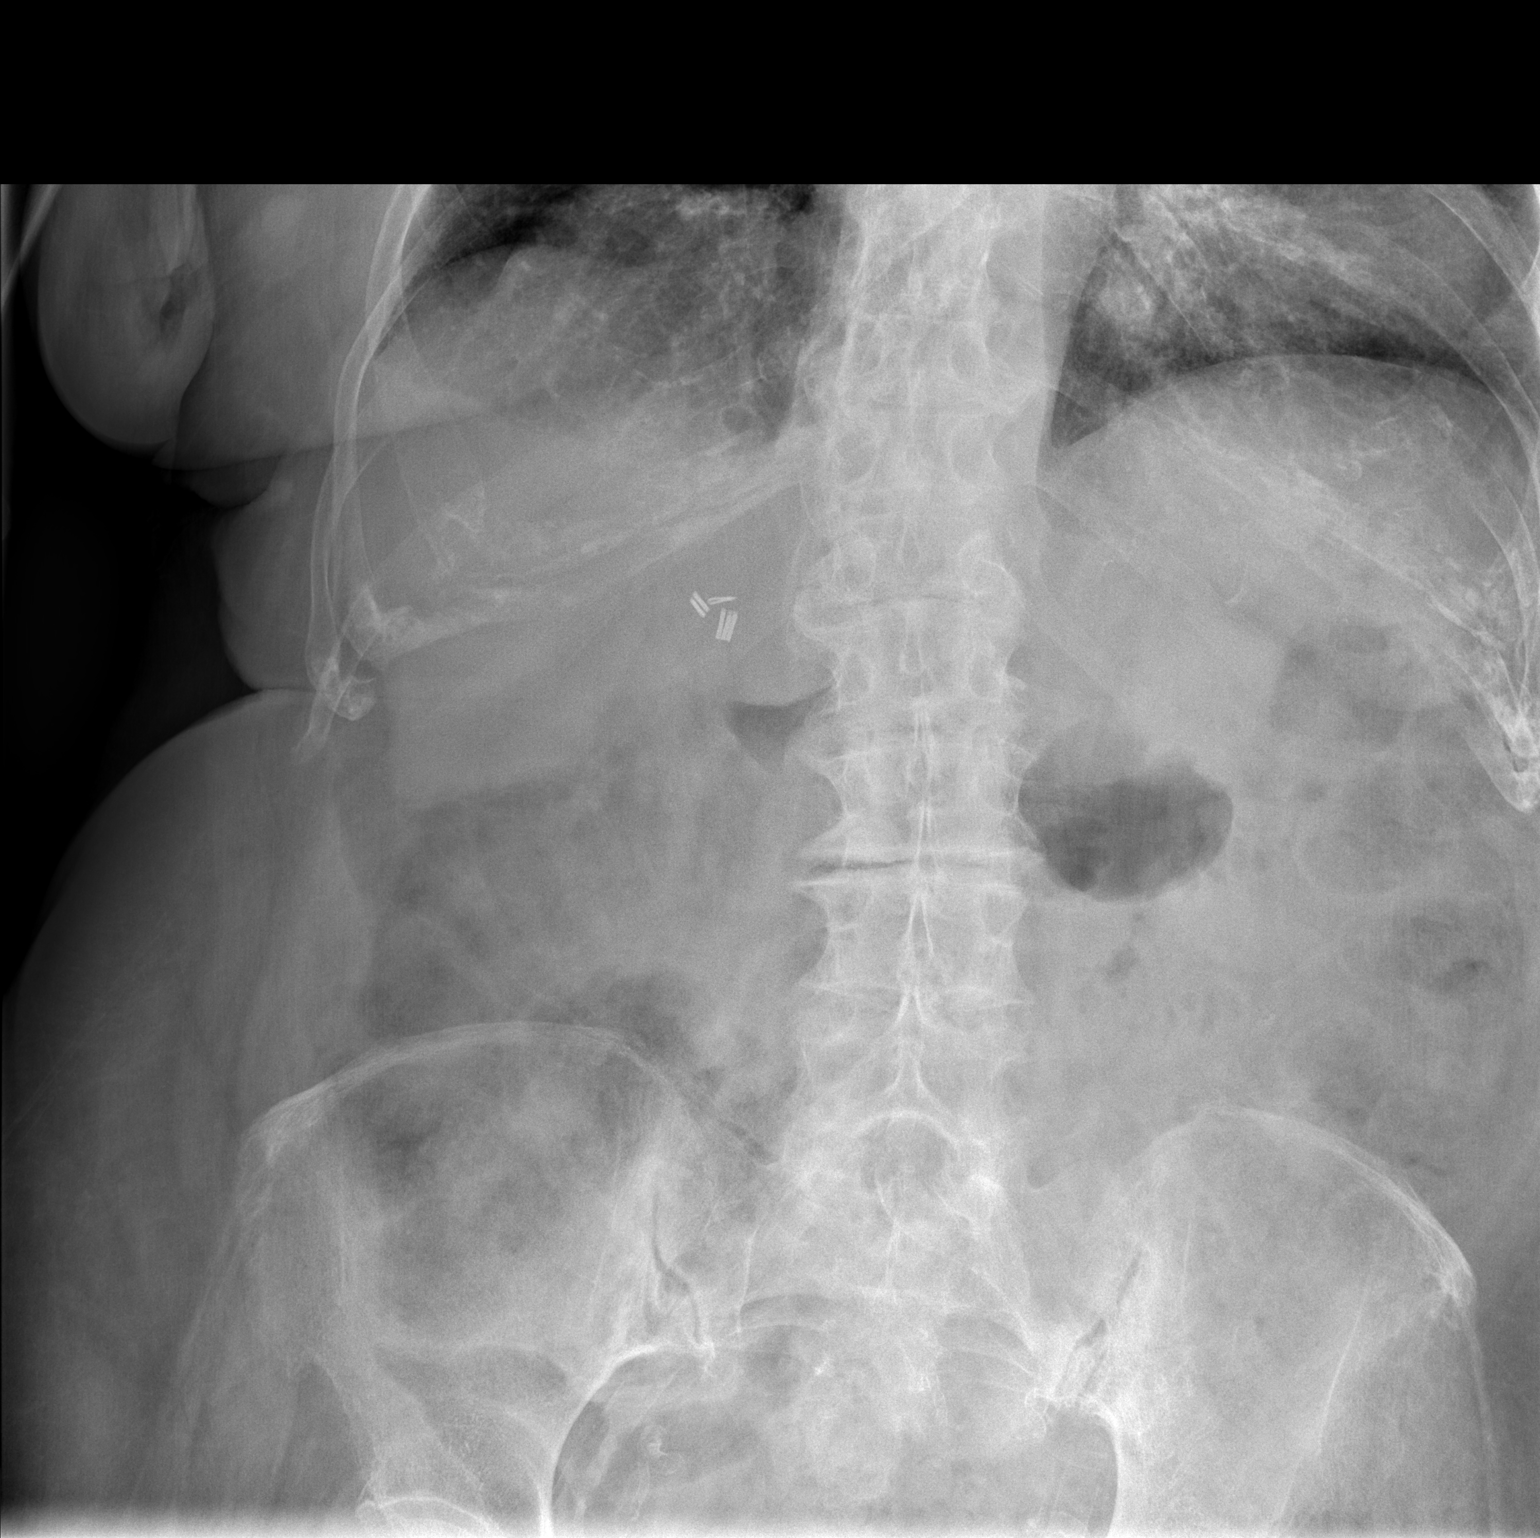

[2 of 2 positions shown; findings below may reference images not displayed]

FINDINGS: The bowel gas pattern is normal. No radio-opaque calculi or other
significant radiographic abnormality are seen. Status post
cholecystectomy. Degenerative changes of the lumbar spine are noted.
IMPRESSION: No evidence of bowel obstruction or ileus.

## 2015-10-03 LAB — POCT INR: INR: 1.3 — AB (ref <=1.1)

## 2015-10-13 LAB — CBC AND DIFFERENTIAL
HCT: 32 % — AB (ref 36–46)
Hemoglobin: 10.4 g/dL — AB (ref 12.0–16.0)
Platelets: 139 10*3/uL — AB (ref 150–399)
WBC: 7.4 10*3/mL

## 2015-10-13 LAB — BASIC METABOLIC PANEL
BUN: 8 mg/dL (ref 4–21)
Creatinine: 2.4 mg/dL — AB (ref 0.5–1.1)
POTASSIUM: 4.3 mmol/L (ref 3.4–5.3)
Sodium: 141 mmol/L (ref 137–147)

## 2015-10-20 LAB — BASIC METABOLIC PANEL
BUN: 32 mg/dL — AB (ref 4–21)
Creatinine: 5.9 mg/dL — AB (ref 0.5–1.1)
GLUCOSE: 186 mg/dL
Potassium: 4.9 mmol/L (ref 3.4–5.3)
Sodium: 131 mmol/L — AB (ref 137–147)

## 2015-10-20 LAB — HEPATIC FUNCTION PANEL
AST: 26 U/L (ref 13–35)
Bilirubin, Total: 0.4 mg/dL

## 2015-10-20 LAB — CBC AND DIFFERENTIAL
HEMATOCRIT: 29 % — AB (ref 36–46)
Hemoglobin: 9.5 g/dL — AB (ref 12.0–16.0)
PLATELETS: 151 10*3/uL (ref 150–399)
WBC: 7.9 10*3/mL

## 2015-10-22 LAB — CBC AND DIFFERENTIAL
HEMATOCRIT: 29 % — AB (ref 36–46)
Hemoglobin: 9.6 g/dL — AB (ref 12.0–16.0)
Platelets: 162 10*3/uL (ref 150–399)
WBC: 7.5 10^3/mL

## 2015-10-22 LAB — BASIC METABOLIC PANEL
BUN: 19 mg/dL (ref 4–21)
CREATININE: 4.9 mg/dL — AB (ref 0.5–1.1)
POTASSIUM: 4.7 mmol/L (ref 3.4–5.3)
SODIUM: 137 mmol/L (ref 137–147)

## 2015-10-23 ENCOUNTER — Encounter: Payer: Self-pay | Admitting: Adult Health

## 2015-10-23 ENCOUNTER — Non-Acute Institutional Stay (SKILLED_NURSING_FACILITY): Payer: Medicare Other | Admitting: Adult Health

## 2015-10-23 DIAGNOSIS — R5381 Other malaise: Secondary | ICD-10-CM

## 2015-10-23 DIAGNOSIS — T82120S Displacement of cardiac electrode, sequela: Secondary | ICD-10-CM

## 2015-10-23 DIAGNOSIS — K5901 Slow transit constipation: Secondary | ICD-10-CM

## 2015-10-23 DIAGNOSIS — R600 Localized edema: Secondary | ICD-10-CM | POA: Diagnosis not present

## 2015-10-23 DIAGNOSIS — M109 Gout, unspecified: Secondary | ICD-10-CM | POA: Diagnosis not present

## 2015-10-23 DIAGNOSIS — K219 Gastro-esophageal reflux disease without esophagitis: Secondary | ICD-10-CM

## 2015-10-23 DIAGNOSIS — E785 Hyperlipidemia, unspecified: Secondary | ICD-10-CM | POA: Diagnosis not present

## 2015-10-23 DIAGNOSIS — N186 End stage renal disease: Secondary | ICD-10-CM

## 2015-10-23 DIAGNOSIS — E1122 Type 2 diabetes mellitus with diabetic chronic kidney disease: Secondary | ICD-10-CM | POA: Diagnosis not present

## 2015-10-23 DIAGNOSIS — Z992 Dependence on renal dialysis: Secondary | ICD-10-CM | POA: Diagnosis not present

## 2015-10-23 DIAGNOSIS — G629 Polyneuropathy, unspecified: Secondary | ICD-10-CM | POA: Diagnosis not present

## 2015-10-23 DIAGNOSIS — Z794 Long term (current) use of insulin: Secondary | ICD-10-CM

## 2015-10-23 DIAGNOSIS — Z8673 Personal history of transient ischemic attack (TIA), and cerebral infarction without residual deficits: Secondary | ICD-10-CM

## 2015-10-23 NOTE — Progress Notes (Signed)
Patient ID: Stacy Odonnell, female   DOB: 03-16-1919, 82 y.o.   MRN: [id number]    DATE:    10/23/15  MRN:  [id number]  BIRTHDAY: [birth year]  Facility:  Nursing Home Location:  [facility]  Nursing Home Room Number: 1004-2  LEVEL OF CARE:  SNF (31)  Contact Information    Name Relation Home Work [place] Daughter [phone]         Code Status History    Date Active Date Inactive Code Status Order ID Comments User Context   12/01/2013  7:59 PM 12/04/2013  6:41 PM DNR [id number]  Therisa Doyne, MD Inpatient    Questions for Most Recent Historical Code Status (Order [id number])    Question Answer Comment   Maintain current active treatments Yes    Do not initiate new interventions Yes        Chief Complaint  Patient presents with  . Hospitalization Follow-up    HISTORY OF PRESENT ILLNESS:   This is a 82 year old female who has been admitted to [facility] on 10/22/15 from [facility]. She has PMH of ESRD on HD, stroke, copd, HTN, CHF, A-fib and GERD. She was treated for recent PPM with lead dislodgement for which she had revision on 10/14/15. She has been admitted for a short-term rehabilitation.  PAST MEDICAL HISTORY:  Past Medical History  Diagnosis Date  . Stroke (HCC)   . Diabetes mellitus without complication (HCC)   . Hypertension   . Dialysis patient (HCC)   . Emphysema lung (HCC)   . Renal failure   . Atrial fibrillation (HCC)   . Hx of bladder cancer   . CKD (chronic kidney disease)   . COPD (chronic obstructive pulmonary disease) (HCC)   . Rheumatoid arthritis (HCC)   . Hearing loss   . Caregiver burden   . At risk for falls   . Hearing impairment   . Bradycardia   . Hypotension   . CHF (congestive heart failure) (HCC)   . SOB (shortness of breath)   . Sleep apnea   . Pneumonia   . ESRD on dialysis (HCC)   . Stroke (cerebrum) (HCC)   . GERD (gastroesophageal reflux disease)   . Hiatal hernia   .  Anemia   . History of transfusion   . Dialysis patient [facility])      CURRENT MEDICATIONS: Reviewed  Patient's Medications  New Prescriptions   No medications on file  Previous Medications   ACETAMINOPHEN (TYLENOL) 325 MG TABLET    Take 650 mg by mouth every 8 (eight) hours.   ALBUTEROL (PROVENTIL) (2.5 MG/3ML) 0.083% NEBULIZER SOLUTION    Take 2.5 mg by nebulization every 6 (six) hours as needed for wheezing or shortness of breath.   ALLOPURINOL (ZYLOPRIM) 100 MG TABLET    Take 100 mg by mouth daily.   ASPIRIN 325 MG TABLET    Take 1 tablet (325 mg total) by mouth daily.   B COMPLEX-VITAMIN C-FOLIC ACID (NEPHRO-VITE) 0.8 MG TABS TABLET    Take 1 tablet by mouth at bedtime.   CALCIUM CARBONATE (OS-CAL) 600 MG TABS TABLET    Take 600 mg by mouth 2 (two) times daily with a meal. With D   DOCUSATE SODIUM (COLACE) 100 MG CAPSULE    Take 100 mg by mouth 2 (two) times daily as needed for mild constipation.    FUROSEMIDE (LASIX) 40 MG TABLET    Take 40 mg by mouth 2 (two)  times daily.   GABAPENTIN (NEURONTIN) 300 MG CAPSULE    Take 300 mg by mouth at bedtime.   INSULIN GLARGINE (LANTUS) 100 UNIT/ML INJECTION    Inject 5 Units into the skin at bedtime.    LIDOCAINE HCL (XYLOCAINE-MPF IJ)    Inject 1 mL as directed once.   OMEGA-3 FISH OIL (MAXEPA) 1000 MG CAPS CAPSULE    Take 2 capsules by mouth daily.   OMEPRAZOLE (PRILOSEC) 20 MG CAPSULE    Take 20 mg by mouth daily.    SEVELAMER CARBONATE (RENVELA) 800 MG TABLET    Take 2,400 mg by mouth 3 (three) times daily with meals. Take 2400 mg TID with a meal and one with snack   SIMVASTATIN (ZOCOR) 40 MG TABLET    Take 40 mg by mouth at bedtime.   Modified Medications   No medications on file  Discontinued Medications   No medications on file     Allergies  Allergen Reactions  . Ace Inhibitors   . Levaquin [Levofloxacin In D5w] Other (See Comments)    Out of it  . Mylanta [Alum & Mag Hydroxide-Simeth] Diarrhea  . Phenothiazines   . Vicodin  [Hydrocodone-Acetaminophen]     Confused, hypoxia  . Vistaril [Hydroxyzine Hcl] Other (See Comments)    unknown  . Codeine Nausea And Vomiting  . Hydroxyzine Nausea And Vomiting  . Levofloxacin Nausea And Vomiting     REVIEW OF SYSTEMS:  GENERAL: no change in appetite, no fatigue, no weight changes, no fever, chills or weakness EYES: Denies change in vision, dry eyes, eye pain, itching or discharge EARS: Denies change in hearing, ringing in ears, or earache NOSE: Denies nasal congestion or epistaxis MOUTH and THROAT: Denies oral discomfort, gingival pain or bleeding, pain from teeth or hoarseness   RESPIRATORY: no cough, SOB, DOE, wheezing, hemoptysis CARDIAC: no chest pain,  or palpitations GI: no abdominal pain, diarrhea, constipation, heart burn, nausea or vomiting GU: Denies dysuria, frequency, hematuria, incontinence, or discharge PSYCHIATRIC: Denies feeling of depression or anxiety. No report of hallucinations, insomnia, paranoia, or agitation    PHYSICAL EXAMINATION  GENERAL APPEARANCE: Well nourished. In no acute distress. Normal body habitus SKIN:  Sacral pressure ulcer stage 2 HEAD: Normal in size and contour. No evidence of trauma EYES: Lids open and close normally. No blepharitis, entropion or ectropion. PERRL. Conjunctivae are clear and sclerae are white. Lenses are without opacity EARS: Pinnae are normal. Has bilateral hearing aids MOUTH and THROAT: Lips are without lesions. Oral mucosa is moist and without lesions. Tongue is normal in shape, size, and color and without lesions NECK: supple, trachea midline, no neck masses, no thyroid tenderness, no thyromegaly LYMPHATICS: no LAN in the neck, no supraclavicular LAN RESPIRATORY: breathing is even & unlabored, BS CTAB CARDIAC: RRR, no murmur,no extra heart sounds, BLE edema 1+, pacemaker surgical site with steri-strips and dry dressing, no erythema GI: abdomen soft, normal BS, no masses, no tenderness, no  hepatomegaly, no splenomegaly EXTREMITIES:  Able to move X 4 extremities; limited ROM on bilateral shoulders; LUE AV fistula + bruit/thrill PSYCHIATRIC: Alert to self and person; disoriented to time and place. Affect and behavior are appropriate  LABS/RADIOLOGY: Labs reviewed: 10/22/15  WBC 10.9 hemoglobin 9.6 hematocrit 29.2 MCV 73.6 platelet 162 sodium 137 potassium 4.7 BUN 19 creatinine 4.90 10/14/15  CK total 32.0  CK-MB 2.29   Troponin <0.300    ASSESSMENT/PLAN:  Physical deconditioning - for rehabilitation  Recent PPM with lead dislodgement S/P RV lead revision -  wound care treatment of surgical site; monitor for infection; follow-up with Dr. Orinda KennerBenjamin Whitaker Strag, cardiology, on 10/30/15; continue Acetaminophen 325 mg 2 tabs = 650 mg Q 8 hours PRN for pain; check CBC  Hx of CVA - continue ASA 325 mg  1 tab daily  Gout - continue allopurinol 100 mg daily  Constipation - continue Colace 100 mg BID PRN  BLE Edema - continue Lasix 40 mg BID; check CMP  Diabetes Mellitus, type 2 -  Continue Lantus 5 units SQ Q HS; check hgbA1c  Neuropathy - continue Neurontin 300 mg 1 capsule Q HS  GERD -  Continue Prilosec 20 mg 1 capsule Q D  ESRD - on HD M-W-F; continue Renvela 800 mg 3 tabs = 2400 mg TID and 1 tab with snack  Hyperlipidemia - continue Zocor 1 tab Q HS    Goals of care:  Short-term rehabilitation   [facility] CenterMEDINA-VARGAS,MONINA, NP [facility] [phone]

## 2015-10-24 ENCOUNTER — Encounter: Payer: Self-pay | Admitting: Internal Medicine

## 2015-10-24 DIAGNOSIS — Z8673 Personal history of transient ischemic attack (TIA), and cerebral infarction without residual deficits: Secondary | ICD-10-CM | POA: Insufficient documentation

## 2015-10-24 NOTE — Progress Notes (Deleted)
LOCATION: [facility]  PCP: No PCP Per Patient   Code Status: Full Code  Goals of care: Advanced Directive information Advanced Directives 06/29/2014  Does patient have an advance directive? No;Yes  Type of Estate agent of [place];Living will  Does patient want to make changes to advanced directive? -  Copy of advanced directive(s) in chart? -  Pre-existing out of facility DNR order (yellow form or pink MOST form) -       Extended Emergency Contact Information Primary Emergency Contact: Tate,Donna Address: [address]          [place], [place] [address] [place] Home Phone: [phone] Mobile Phone: [phone] Relation: Daughter Secondary Emergency Contact: [place] Mobile Phone: [phone] Relation: Grandson   Allergies  Allergen Reactions  . Ace Inhibitors   . Levaquin [Levofloxacin In D5w] Other (See Comments)    Out of it  . Mylanta [Alum & Mag Hydroxide-Simeth] Diarrhea  . Phenothiazines   . Vicodin [Hydrocodone-Acetaminophen]     Confused, hypoxia  . Vistaril [Hydroxyzine Hcl] Other (See Comments)    unknown  . Codeine Nausea And Vomiting  . Hydroxyzine Nausea And Vomiting  . Levofloxacin Nausea And Vomiting    Chief Complaint  Patient presents with  . New Admit To SNF    New Admission     HPI:  Patient is a 82 y.o. female seen today for short term rehabilitation post hospital admission from   Review of Systems:  Constitutional: Negative for fever, chills, malaise and diaphoresis.  HENT: Negative for headache, congestion, nasal discharge, hearing loss, sore throat, difficulty swallowing.   Eyes: Negative for blurred vision, double vision and discharge.  Respiratory: Negative for cough, shortness of breath and wheezing.   Cardiovascular: Negative for chest pain, palpitations, leg swelling.  Gastrointestinal: Negative for heartburn, nausea, vomiting, abdominal pain,  loss of appetite, melena, diarrhea and constipation.  Genitourinary: Negative for dysuria and flank pain.  Musculoskeletal: Negative for back pain, fall.  Skin: Negative for itching, rash.  Neurological: Negative for weakness and dizziness. Psychiatric/Behavioral: Negative for depression, anxiety, insomnia and memory loss.    Past Medical History  Diagnosis Date  . Stroke (HCC)   . Diabetes mellitus without complication (HCC)   . Hypertension   . Dialysis patient (HCC)   . Emphysema lung (HCC)   . Renal failure   . Atrial fibrillation (HCC)   . Hx of bladder cancer   . CKD (chronic kidney disease)   . COPD (chronic obstructive pulmonary disease) (HCC)   . Rheumatoid arthritis (HCC)   . Hearing loss   . Caregiver burden   . At risk for falls   . Hearing impairment   . Bradycardia   . Hypotension   . CHF (congestive heart failure) (HCC)   . SOB (shortness of breath)   . Sleep apnea   . Pneumonia   . ESRD on dialysis (HCC)   . Stroke (cerebrum) (HCC)   . GERD (gastroesophageal reflux disease)   . Hiatal hernia   . Anemia   . History of transfusion   . Dialysis patient [facility])    Past Surgical History  Procedure Laterality Date  . Abdominal hysterectomy    . Cholecystectomy    . Tonsillectomy    . Joint replacement    . Shoulder surgery    . Av fistula placement    . Cystoscopy    . Transurethral resection of bladder tumor    .  Total knee arthroplasty    . Laminectomy    . Back surgery    . Cataract extraction    . Pacemaker insertion     Social History:   reports that she has quit smoking. She does not have any smokeless tobacco history on file. She reports that she does not drink alcohol or use illicit drugs.  No family history on file.  Medications:   Medication List       This list is accurate as of: 10/24/15  1:22 PM.  Always use your most recent med list.               acetaminophen 325 MG tablet  Commonly known as:  TYLENOL  Take 650 mg by  mouth every 8 (eight) hours as needed for mild pain.     albuterol (2.5 MG/3ML) 0.083% nebulizer solution  Commonly known as:  PROVENTIL  Take 2.5 mg by nebulization every 6 (six) hours as needed for wheezing or shortness of breath.     allopurinol 100 MG tablet  Commonly known as:  ZYLOPRIM  Take 100 mg by mouth daily.     aspirin 325 MG tablet  Take 1 tablet (325 mg total) by mouth daily.     b complex-vitamin c-folic acid 0.8 MG Tabs tablet  Take 1 tablet by mouth at bedtime.     calcium carbonate 600 MG Tabs tablet  Commonly known as:  OS-CAL  Take 600 mg by mouth 2 (two) times daily with a meal. With D     docusate sodium 100 MG capsule  Commonly known as:  COLACE  Take 100 mg by mouth 2 (two) times daily as needed for mild constipation.     furosemide 40 MG tablet  Commonly known as:  LASIX  Take 40 mg by mouth 2 (two) times daily.     gabapentin 300 MG capsule  Commonly known as:  NEURONTIN  Take 300 mg by mouth at bedtime.     insulin glargine 100 UNIT/ML injection  Commonly known as:  LANTUS  Inject 5 Units into the skin at bedtime.     omega-3 fish oil 1000 MG Caps capsule  Commonly known as:  MAXEPA  Take 2 capsules by mouth daily.     omeprazole 20 MG capsule  Commonly known as:  PRILOSEC  Take 20 mg by mouth daily.     sevelamer carbonate 800 MG tablet  Commonly known as:  RENVELA  Take 2,400 mg by mouth 3 (three) times daily with meals. Take 2400 mg TID with a meal and one with snack     simvastatin 40 MG tablet  Commonly known as:  ZOCOR  Take 40 mg by mouth at bedtime.     XYLOCAINE-MPF IJ  Inject 1 mL as directed once.        Immunizations:  There is no immunization history on file for this patient.   Physical Exam: *** Filed Vitals:   10/24/15 1312  BP: 94/42  Pulse: 78  Temp: 96.9 F (36.1 C)  TempSrc: Oral  Resp: 16  Height: 5\' 3"  (1.6 m)  Weight: 166 lb 6.4 oz (75.479 kg)  SpO2: 92%   Body mass index is 29.48  kg/(m^2).  General- elderly female, well built, in no acute distress Head- normocephalic, atraumatic Ears- left ear normal tympanic membrane and normal external ear canal , right ear normal tympanic membrane and normal external ear canal Nose- normal nasal mucosa, no maxillary or frontal sinus tenderness, no  nasal discharge Throat- moist mucus membrane, normal oropharynx, dentition  Eyes- PERRLA, EOMI, no pallor, no icterus, no discharge, normal conjunctiva, normal sclera Neck- no cervical lymphadenopathy, no supraclavicular lymphadenopathy, no thyromegaly, no jugular vein distension, no carotid bruit Chest- no chest wall deformities, no chest wall tenderness Breast- normal appearance, no masses or lumps on palpation, normal nipple and areola exam, no axillary lymphadenopathy Cardiovascular- normal s1,s2, no murmurs/ rubs/ gallops, dorsalis pedis and radial pulses, leg edema Respiratory- bilateral clear to auscultation, no wheeze, no rhonchi, no crackles, no use of accessory muscles Abdomen- bowel sounds present, soft, non tender, no organomegaly, no abdominal bruits, no guarding or rigidity, no CVA tenderness Pelvic exam- normal pelvic exam, no adenexal or cervical motion tenderness Musculoskeletal- able to move all 4 extremities, no spinal and paraspinal tenderness, normal back curvature, steady gait, no use of assistive device, normal range of motion Neurological- no focal deficit, alert and oriented to person, place and time, normal reflexes, normal muscle strength, normal sensation to fine touch and vibration Skin- warm and dry Nails- hypertrophy, ingrown Psychiatry- normal mood and affect    Labs reviewed: Basic Metabolic Panel:  Recent Labs  54/09/81 10/22/15  NA 141 137  K 4.3 4.7  BUN 8 19  CREATININE 2.4* 4.9*   Liver Function Tests: No results for input(s): AST, ALT, ALKPHOS, BILITOT, PROT, ALBUMIN in the last 8760 hours. No results for input(s): LIPASE, AMYLASE in the  last 8760 hours. No results for input(s): AMMONIA in the last 8760 hours. CBC:  Recent Labs  10/13/15 10/22/15  WBC 7.4 7.5  HGB 10.4* 9.6*  HCT 32* 29*  PLT 139* 162   Cardiac Enzymes: No results for input(s): CKTOTAL, CKMB, CKMBINDEX, TROPONINI in the last 8760 hours. BNP: Invalid input(s): POCBNP CBG: No results for input(s): GLUCAP in the last 8760 hours.  Radiological Exams: No results found.  Assessment/Plan There are no diagnoses linked to this encounter.    Goals of care: short term rehabilitation   Labs/tests ordered:  Family/ staff Communication: reviewed care plan with patient and nursing supervisor    Oneal Grout, MD Internal Medicine [facility] [address] [place], [place] [address] Cell Phone (Monday-Friday 8 am - 5 pm): [phone] On Call: [phone] and follow prompts after 5 pm and on weekends Office Phone: [phone] Office Fax: [phone]

## 2015-10-27 NOTE — Progress Notes (Signed)
This encounter was created in error - please disregard.

## 2015-10-30 ENCOUNTER — Non-Acute Institutional Stay (SKILLED_NURSING_FACILITY): Payer: Medicare Other | Admitting: Internal Medicine

## 2015-10-30 ENCOUNTER — Encounter: Payer: Self-pay | Admitting: Internal Medicine

## 2015-10-30 DIAGNOSIS — E1142 Type 2 diabetes mellitus with diabetic polyneuropathy: Secondary | ICD-10-CM

## 2015-10-30 DIAGNOSIS — M109 Gout, unspecified: Secondary | ICD-10-CM

## 2015-10-30 DIAGNOSIS — N186 End stage renal disease: Secondary | ICD-10-CM

## 2015-10-30 DIAGNOSIS — R600 Localized edema: Secondary | ICD-10-CM | POA: Diagnosis not present

## 2015-10-30 DIAGNOSIS — T82128S Displacement of other cardiac electronic device, sequela: Secondary | ICD-10-CM

## 2015-10-30 DIAGNOSIS — Z8673 Personal history of transient ischemic attack (TIA), and cerebral infarction without residual deficits: Secondary | ICD-10-CM

## 2015-10-30 DIAGNOSIS — R5381 Other malaise: Secondary | ICD-10-CM | POA: Diagnosis not present

## 2015-10-30 DIAGNOSIS — Z992 Dependence on renal dialysis: Secondary | ICD-10-CM

## 2015-10-30 DIAGNOSIS — K5900 Constipation, unspecified: Secondary | ICD-10-CM | POA: Diagnosis not present

## 2015-10-30 DIAGNOSIS — K219 Gastro-esophageal reflux disease without esophagitis: Secondary | ICD-10-CM | POA: Diagnosis not present

## 2015-10-30 NOTE — Progress Notes (Signed)
LOCATION: [facility]  PCP: No PCP Per Patient   Code Status: Full Code  Goals of care: Advanced Directive information Advanced Directives 06/29/2014  Does patient have an advance directive? No;Yes  Type of Estate agent of [place];Living will  Does patient want to make changes to advanced directive? -  Copy of advanced directive(s) in chart? -  Pre-existing out of facility DNR order (yellow form or pink MOST form) -       Extended Emergency Contact Information Primary Emergency Contact: Tate,Donna Address: [address]          [place], [place] [address] [place] Home Phone: [phone] Mobile Phone: [phone] Relation: Daughter Secondary Emergency Contact: [place] Mobile Phone: [phone] Relation: Grandson   Allergies  Allergen Reactions  . Ace Inhibitors   . Levaquin [Levofloxacin In D5w] Other (See Comments)    Out of it  . Mylanta [Alum & Mag Hydroxide-Simeth] Diarrhea  . Phenothiazines   . Vicodin [Hydrocodone-Acetaminophen]     Confused, hypoxia  . Vistaril [Hydroxyzine Hcl] Other (See Comments)    unknown  . Codeine Nausea And Vomiting  . Hydroxyzine Nausea And Vomiting  . Levofloxacin Nausea And Vomiting    Chief Complaint  Patient presents with  . New Admit To SNF    New Admission     HPI:  Patient is a 82 y.o. female seen today for short term rehabilitation post hospital admission with dislodgement of the pacemaker lead. She underwent revision on 10/14/15. She is seen in her room today. She complaints of right sided chest wall soreness. She has PMH of ESRD on HD, stroke, copd, HTN, CHF among others.   Review of Systems:  Constitutional: Negative for fever, chills, diaphoresis. Energy slowly coming back.  HENT: Negative for headache, congestion, hearing loss. Positive for clear nasal discharge.   Eyes: Negative for blurred vision, double vision and discharge.    Respiratory: Negative for cough and wheezing. Positive for shortness of breath with exertion.  Cardiovascular: Negative for palpitations, leg swelling. Positive for right sided chest wall pain.  Gastrointestinal: Negative for nausea, vomiting, abdominal pain, loss of appetite. Positive for occassional heartburn. Last bowel movement was this morning. Genitourinary: Negative for dysuria and flank pain. Makes some urine.   Musculoskeletal: Negative for back pain, fall in the facility.  Skin: Negative for itching, rash.  Neurological: Negative for dizziness. Psychiatric/Behavioral: Negative for depression   Past Medical History  Diagnosis Date  . Stroke (HCC)   . Diabetes mellitus without complication (HCC)   . Hypertension   . Dialysis patient (HCC)   . Emphysema lung (HCC)   . Renal failure   . Atrial fibrillation (HCC)   . Hx of bladder cancer   . CKD (chronic kidney disease)   . COPD (chronic obstructive pulmonary disease) (HCC)   . Rheumatoid arthritis (HCC)   . Hearing loss   . Caregiver burden   . At risk for falls   . Hearing impairment   . Bradycardia   . Hypotension   . CHF (congestive heart failure) (HCC)   . SOB (shortness of breath)   . Sleep apnea   . Pneumonia   . ESRD on dialysis (HCC)   . Stroke (cerebrum) (HCC)   . GERD (gastroesophageal reflux disease)   . Hiatal hernia   . Anemia   . History of transfusion   . Dialysis patient [facility])    Past Surgical History  Procedure  Laterality Date  . Abdominal hysterectomy    . Cholecystectomy    . Tonsillectomy    . Joint replacement    . Shoulder surgery    . Av fistula placement    . Cystoscopy    . Transurethral resection of bladder tumor    . Total knee arthroplasty    . Laminectomy    . Back surgery    . Cataract extraction    . Pacemaker insertion     Social History:   reports that she has quit smoking. She does not have any smokeless tobacco history on file. She reports that she does not drink  alcohol or use illicit drugs.  History reviewed. No pertinent family history.  Medications:   Medication List       This list is accurate as of: 10/30/15 12:03 PM.  Always use your most recent med list.               acetaminophen 325 MG tablet  Commonly known as:  TYLENOL  Take 650 mg by mouth every 8 (eight) hours as needed for mild pain.     albuterol (2.5 MG/3ML) 0.083% nebulizer solution  Commonly known as:  PROVENTIL  Take 2.5 mg by nebulization every 6 (six) hours as needed for wheezing or shortness of breath.     allopurinol 100 MG tablet  Commonly known as:  ZYLOPRIM  Take 100 mg by mouth daily.     aspirin 325 MG tablet  Take 1 tablet (325 mg total) by mouth daily.     b complex-vitamin c-folic acid 0.8 MG Tabs tablet  Take 1 tablet by mouth at bedtime.     calcium carbonate 600 MG Tabs tablet  Commonly known as:  OS-CAL  Take 600 mg by mouth 2 (two) times daily with a meal. With D     docusate sodium 100 MG capsule  Commonly known as:  COLACE  Take 100 mg by mouth 2 (two) times daily as needed for mild constipation.     gabapentin 100 MG capsule  Commonly known as:  NEURONTIN  Take 100 mg by mouth at bedtime.     insulin glargine 100 UNIT/ML injection  Commonly known as:  LANTUS  Inject 5 Units into the skin at bedtime.     Omega-3 Fish Oil 300 MG Caps  Take by mouth. Take 2 GM by mouth daily     omeprazole 20 MG capsule  Commonly known as:  PRILOSEC  Take 20 mg by mouth daily.     PROCEL PO  Take 1 scoop by mouth 2 (two) times daily. For nutritional and protein     sevelamer carbonate 800 MG tablet  Commonly known as:  RENVELA  Take 800 mg by mouth 3 (three) times daily with meals.     simvastatin 40 MG tablet  Commonly known as:  ZOCOR  Take 40 mg by mouth at bedtime.     XYLOCAINE-MPF IJ  Inject 1 mL as directed once.        Immunizations:  There is no immunization history on file for this patient.   Physical Exam: Filed  Vitals:   10/30/15 1043  BP: 118/67  Pulse: 73  Temp: 97.2 F (36.2 C)  TempSrc: Oral  Resp: 18  Height:  (1.6 m)  Weight: 166 lb 6.4 oz (75.479 kg)  SpO2: 90%   Body mass index is 29.48 kg/(m^2).  General- elderly female, overweight, in no acute distress Head- normocephalic, atraumatic, has  hearing aid Nose- no maxillary or frontal sinus tenderness, clear nasal discharge Throat- moist mucus membrane, dentures Eyes- PERRLA, EOMI, no pallor, no icterus, no discharge, normal conjunctiva, normal sclera Neck- no cervical lymphadenopathy Cardiovascular- normal s1,s2, no murmur, reproducible pain to right chest wall, edema 2+ Respiratory- bilateral clear to auscultation, no wheeze, no rhonchi, no crackles, no use of accessory muscles Abdomen- bowel sounds present, soft, non tender Musculoskeletal- able to move all 4 extremities, generalized weakness, arthritis changes to her fingers Neurological-  alert and oriented  Skin- warm and dry, unstagable pressure ulcer to sacrum Psychiatry- normal mood and affect    Labs reviewed: Basic Metabolic Panel:  Recent Labs  40/98/1102/27/17 10/20/15 10/22/15  NA 141 131* 137  K 4.3 4.9 4.7  BUN 8 32* 19  CREATININE 2.4* 5.9* 4.9*   Liver Function Tests:  Recent Labs  10/20/15  AST 26   No results for input(s): LIPASE, AMYLASE in the last 8760 hours. No results for input(s): AMMONIA in the last 8760 hours. CBC:  Recent Labs  10/13/15 10/20/15 10/22/15  WBC 7.4 7.9 7.5  HGB 10.4* 9.5* 9.6*  HCT 32* 29* 29*  PLT 139* 151 162   Cardiac Enzymes: No results for input(s): CKTOTAL, CKMB, CKMBINDEX, TROPONINI in the last 8760 hours. BNP: Invalid input(s): POCBNP CBG: No results for input(s): GLUCAP in the last 8760 hours.  Radiological Exams: XR Chest Portable 10/14/15. Clinical data: Post pacemaker placement, question pneumothorax. Impression: No pneumothorax following pacemaker placement.  CT Chest Wo Contrast 10/13/15. Clinical  data: 82 year old with chest pain which started today after dialysis. Impression: No acute intrathoracic pathology.  XR Chest Portable 10/13/15. Clinical data: Chest pain.  Impression: Similar mild congestive heart failure.   Assessment/Plan   Physical deconditioning Will have her work with physical therapy and occupational therapy team to help with gait training and muscle strengthening exercises.fall precautions. Skin care. Encourage to be out of bed.   Pacemaker lead dislodgement S/p lead revision. Continue wound care. Continue tylenol as needed for pain. Has f/u with cardiology  ESRD Continue HD 3 days a week. Continue renvela with meals  Bilateral lower extremity edema Off lasix. Add ted hose and keep legs elevated at rest. Continue with HD. Daily weight check x 1 week  History of CVA Continue aspirin 325 mg daily  Diabetes mellitus with neuropathy Continue lantus 5 u daily and neurontin 100 mg qhs  gerd Continue prilosec 20 mg daily  Gout No recent flare. continue allopurinol 100 mg daily  Constipation continue Colace 100 mg BID PRN   Goals of care: short term rehabilitation    Family/ staff Communication: reviewed care plan with patient and nursing supervisor    Oneal GroutMAHIMA Alyn Riedinger, MD Internal Medicine [facility] [address] [place], [place] [address] Cell Phone (Monday-Friday 8 am - 5 pm): [phone] On Call: [phone] and follow prompts after 5 pm and on weekends Office Phone: [phone] Office Fax: [phone]

## 2015-11-15 DEATH — deceased
